# Patient Record
Sex: Male | Born: 1985 | Race: Black or African American | Hispanic: No | Marital: Single | Smoking: Former smoker
Health system: Southern US, Community
[De-identification: ages and names within clinical notes are randomized; demographics above are authoritative.]

## PROBLEM LIST (undated history)

## (undated) DIAGNOSIS — K219 Gastro-esophageal reflux disease without esophagitis: Secondary | ICD-10-CM

## (undated) DIAGNOSIS — C801 Malignant (primary) neoplasm, unspecified: Secondary | ICD-10-CM

## (undated) DIAGNOSIS — L509 Urticaria, unspecified: Secondary | ICD-10-CM

## (undated) DIAGNOSIS — C329 Malignant neoplasm of larynx, unspecified: Secondary | ICD-10-CM

## (undated) DIAGNOSIS — J45909 Unspecified asthma, uncomplicated: Secondary | ICD-10-CM

## (undated) HISTORY — PX: WRIST SURGERY: SHX841

## (undated) HISTORY — DX: Urticaria, unspecified: L50.9

---
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
## 2004-04-24 ENCOUNTER — Emergency Department (HOSPITAL_COMMUNITY): Admission: EM | Admit: 2004-04-24 | Discharge: 2004-04-24 | Payer: Self-pay

## 2006-06-07 ENCOUNTER — Emergency Department (HOSPITAL_COMMUNITY): Admission: EM | Admit: 2006-06-07 | Discharge: 2006-06-08 | Payer: Self-pay | Admitting: Emergency Medicine

## 2010-06-10 ENCOUNTER — Emergency Department (HOSPITAL_COMMUNITY)
Admission: EM | Admit: 2010-06-10 | Discharge: 2010-06-11 | Payer: Self-pay | Source: Home / Self Care | Admitting: Emergency Medicine

## 2010-09-02 ENCOUNTER — Emergency Department (HOSPITAL_COMMUNITY)
Admission: EM | Admit: 2010-09-02 | Discharge: 2010-09-02 | Disposition: A | Discharge status: Home / Self Care | Payer: Self-pay | Attending: Emergency Medicine | Admitting: Emergency Medicine

## 2010-09-02 DIAGNOSIS — M25519 Pain in unspecified shoulder: Secondary | ICD-10-CM | POA: Insufficient documentation

## 2010-09-02 DIAGNOSIS — M79609 Pain in unspecified limb: Secondary | ICD-10-CM | POA: Insufficient documentation

## 2010-09-02 DIAGNOSIS — R209 Unspecified disturbances of skin sensation: Secondary | ICD-10-CM | POA: Insufficient documentation

## 2010-09-03 ENCOUNTER — Emergency Department (HOSPITAL_COMMUNITY)
Admission: EM | Admit: 2010-09-03 | Discharge: 2010-09-04 | Disposition: A | Discharge status: Home / Self Care | Payer: Self-pay | Attending: Emergency Medicine | Admitting: Emergency Medicine

## 2010-09-03 DIAGNOSIS — M25519 Pain in unspecified shoulder: Secondary | ICD-10-CM | POA: Insufficient documentation

## 2010-09-03 DIAGNOSIS — M79609 Pain in unspecified limb: Secondary | ICD-10-CM | POA: Insufficient documentation

## 2010-09-03 DIAGNOSIS — R209 Unspecified disturbances of skin sensation: Secondary | ICD-10-CM | POA: Insufficient documentation

## 2011-03-15 ENCOUNTER — Emergency Department (HOSPITAL_BASED_OUTPATIENT_CLINIC_OR_DEPARTMENT_OTHER)
Admission: EM | Admit: 2011-03-15 | Discharge: 2011-03-15 | Disposition: A | Discharge status: Home / Self Care | Payer: Self-pay | Attending: Emergency Medicine | Admitting: Emergency Medicine

## 2011-03-15 DIAGNOSIS — T148XXA Other injury of unspecified body region, initial encounter: Secondary | ICD-10-CM

## 2011-03-15 DIAGNOSIS — IMO0002 Reserved for concepts with insufficient information to code with codable children: Secondary | ICD-10-CM | POA: Insufficient documentation

## 2011-03-15 DIAGNOSIS — F17200 Nicotine dependence, unspecified, uncomplicated: Secondary | ICD-10-CM | POA: Insufficient documentation

## 2011-03-15 DIAGNOSIS — X58XXXA Exposure to other specified factors, initial encounter: Secondary | ICD-10-CM | POA: Insufficient documentation

## 2011-03-15 DIAGNOSIS — Y9229 Other specified public building as the place of occurrence of the external cause: Secondary | ICD-10-CM | POA: Insufficient documentation

## 2011-03-15 MED ORDER — IBUPROFEN 800 MG PO TABS: 800.0000 mg | ORAL_TABLET | Freq: Three times a day (TID) | ORAL | Status: AC

## 2011-03-15 NOTE — ED Provider Notes (Signed)
History     CSN: [id number] Arrival date & time: 03/15/2011  5:33 PM   None     Chief Complaint  Patient presents with  . Leg Pain    (Consider location/radiation/quality/duration/timing/severity/associated sxs/prior treatment) HPI History provided by patient.  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  Developed cramping pain in right posterior leg after work yesterday evening.  Pain constant and aggravated by resting for extended periods of time.  No known trauma.  No risk factors for PE and no associated edema.   History reviewed. No pertinent past medical history.  History reviewed. No pertinent past surgical history.  No family history on file.  History  Substance Use Topics  . Smoking status: Current Some Day Smoker    Types: Cigars  . Smokeless tobacco: Never Used  . Alcohol Use: No      Review of Systems  All other systems reviewed and are negative.    Allergies  Review of patient's allergies indicates no known allergies.  Home Medications   Current Outpatient Rx  Name Route Sig Dispense Refill  . IBUPROFEN 800 MG PO TABS Oral Take 1 tablet (800 mg total) by mouth 3 (three) times daily. 12 tablet 0    BP 123/64  Pulse 72  Temp(Src) 98.3 F (36.8 C) (Oral)  Resp 16  Ht 5\' 7"  (1.702 m)  Wt 130 lb (58.968 kg)  BMI 20.36 kg/m2  SpO2 100%  Physical Exam  Nursing note and vitals reviewed. Constitutional: He is oriented to person, place, and time. He appears well-developed and well-nourished. No distress.  HENT:  Head: Normocephalic and atraumatic.  Eyes:       Normal appearance  Neck: Normal range of motion.  Cardiovascular: Normal rate and regular rhythm.   Pulmonary/Chest: Effort normal and breath sounds normal.  Musculoskeletal:       No deformity, skin changes or edema of RLE  Mild ttp right posterior thigh.  Full ROM of all joints but mild pain w/ inversion/eversion hip.  NV intact.   Neurological: He is alert and oriented to person,  place, and time.  Skin: Skin is warm and dry. No rash noted.  Psychiatric: He has a normal mood and affect. His behavior is normal.    ED Course  Procedures (including critical care time)  Labs Reviewed - No data to display No results found.   1. 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  S/sx most consistent w/ hamstring strain.  No risk factors for PE and no edema on exam.  Pt ambulates w/out difficulty.  Recommended rest, heat, NSAID.  Strict return precautions discussed.     Medical screening examination/treatment/procedure(s) were performed by non-physician practitioner and as supervising physician I was immediately available for consultation/collaboration. Osvaldo Human, M.D.     Arie Sabina Maxbass, PA 03/16/11 0725  Carleene Cooper III, MD 03/16/11 [phone]

## 2011-03-15 NOTE — ED Notes (Signed)
Right lower leg/calf pain since last night, pain radiating up leg

## 2011-05-07 ENCOUNTER — Encounter (HOSPITAL_BASED_OUTPATIENT_CLINIC_OR_DEPARTMENT_OTHER): Payer: Self-pay | Admitting: *Deleted

## 2011-05-07 ENCOUNTER — Emergency Department (HOSPITAL_BASED_OUTPATIENT_CLINIC_OR_DEPARTMENT_OTHER)
Admission: EM | Admit: 2011-05-07 | Discharge: 2011-05-08 | Disposition: A | Discharge status: Home / Self Care | Payer: Self-pay | Attending: Emergency Medicine | Admitting: Emergency Medicine

## 2011-05-07 DIAGNOSIS — M79604 Pain in right leg: Secondary | ICD-10-CM

## 2011-05-07 DIAGNOSIS — M79609 Pain in unspecified limb: Secondary | ICD-10-CM | POA: Insufficient documentation

## 2011-05-07 NOTE — ED Notes (Signed)
Patient ambulates from waiting room to stretcher with no difficulty and without assistance

## 2011-05-07 NOTE — ED Notes (Signed)
States he pulled a muscle in his right leg, first stated back in the summer and then stated a few weeks ago. Now it is hurting again.

## 2011-05-08 ENCOUNTER — Encounter (HOSPITAL_BASED_OUTPATIENT_CLINIC_OR_DEPARTMENT_OTHER): Payer: Self-pay | Admitting: Emergency Medicine

## 2011-05-08 MED ORDER — IBUPROFEN 800 MG PO TABS
800.0000 mg | ORAL_TABLET | Freq: Once | ORAL | Status: AC
  Administered 2011-05-08: 800 mg via ORAL
  Filled 2011-05-08: qty 1

## 2011-05-08 NOTE — ED Provider Notes (Signed)
History     CSN: [id number]  Arrival date & time 05/07/11  2200   First MD Initiated Contact with Patient 05/08/11 0106      Chief Complaint  Patient presents with  . Leg Pain    (Consider location/radiation/quality/duration/timing/severity/associated sxs/prior treatment) HPI Comments: Patient states that he pulled a muscle in his right lower leg in October.  It had recovered but yesterday started having sharp pain in that right lower leg again.  He denies any new injury.  He has no swelling, redness, fevers associated with this.  Patient has no recent long car or plane trips.  No shortness of breath.  No fevers.  He is able to ambulate and bear weight.  He did not take any medication at home for decided to come into the ER for further evaluation.  He does not have a primary care physician.  Patient is a 25 y.o. male presenting with leg pain. The history is provided by the patient. No language interpreter was used.  Leg Pain  The incident occurred yesterday. The incident occurred at home. There was no injury mechanism.    History reviewed. No pertinent past medical history.  History reviewed. No pertinent past surgical history.  History reviewed. No pertinent family history.  History  Substance Use Topics  . Smoking status: Current Some Day Smoker    Types: Cigars  . Smokeless tobacco: Never Used  . Alcohol Use: No      Review of Systems  Constitutional: Negative.  Negative for fever and chills.  HENT: Negative.   Eyes: Negative.  Negative for discharge and redness.  Respiratory: Negative.  Negative for cough and shortness of breath.   Cardiovascular: Negative.  Negative for chest pain.  Gastrointestinal: Negative.  Negative for nausea, vomiting and abdominal pain.  Genitourinary: Negative.  Negative for hematuria.  Musculoskeletal: Negative for back pain.  Skin: Negative.  Negative for color change and rash.  Neurological: Negative for syncope and headaches.    Hematological: Negative.  Negative for adenopathy.  Psychiatric/Behavioral: Negative.  Negative for confusion.  All other systems reviewed and are negative.    Allergies  Review of patient's allergies indicates no known allergies.  Home Medications  No current outpatient prescriptions on file.  BP 112/73  Pulse 73  Temp(Src) 98.2 F (36.8 C) (Oral)  Resp 20  SpO2 100%  Physical Exam  Constitutional: He is oriented to person, place, and time. He appears well-developed and well-nourished.  HENT:  Head: Normocephalic and atraumatic.  Eyes: Conjunctivae and EOM are normal. Pupils are equal, round, and reactive to light.  Neck: Normal range of motion. Neck supple.  Pulmonary/Chest: Effort normal.  Musculoskeletal: Normal range of motion. He exhibits no edema and no tenderness.       Palpable DP pulses bilaterally.  Legs appear symmetrical and are without any edema or swelling.  No crepitus lower legs.  Patient has normal strength in his right quadriceps and right calf.  No tenderness to palpation of his leg.  Neurological: He is alert and oriented to person, place, and time.  Skin: Skin is warm and dry. No rash noted. No erythema.  Psychiatric: He has a normal mood and affect. His behavior is normal. Judgment and thought content normal.    ED Course  Procedures (including critical care time)  Labs Reviewed - No data to display No results found.   No diagnosis found.    MDM  Patient with unclear etiology for his left lower leg pain.  There  is no indication that the patient has a DVT given that is not swollen and has no risk factors for DVT.  Patient has no known injury.  Patient is able to ambulate and bear weight on this leg.  There is no erythema or crepitus to indicate infection.  Patient's been counseled on to use ice to decrease inflammation and Tylenol and ibuprofen for pain.        Nat Christen, MD 05/08/11 [phone]

## 2011-09-07 ENCOUNTER — Emergency Department (HOSPITAL_COMMUNITY)
Admission: EM | Admit: 2011-09-07 | Discharge: 2011-09-07 | Disposition: A | Discharge status: Home / Self Care | Payer: Self-pay | Attending: Emergency Medicine | Admitting: Emergency Medicine

## 2011-09-07 ENCOUNTER — Encounter (HOSPITAL_COMMUNITY): Payer: Self-pay | Admitting: Emergency Medicine

## 2011-09-07 DIAGNOSIS — Z87891 Personal history of nicotine dependence: Secondary | ICD-10-CM | POA: Insufficient documentation

## 2011-09-07 DIAGNOSIS — M79609 Pain in unspecified limb: Secondary | ICD-10-CM | POA: Insufficient documentation

## 2011-09-07 DIAGNOSIS — M79606 Pain in leg, unspecified: Secondary | ICD-10-CM

## 2011-09-07 NOTE — ED Provider Notes (Signed)
History     CSN: [id number]  Arrival date & time 09/07/11  2039   First MD Initiated Contact with Patient 09/07/11 2155      Chief Complaint  Patient presents with  . 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 He denies injuring it. He says that it started last night and he didn't do anythign to try to alleviate the pain yet. He has been seen for leg pain two times in the past. He was instructed to try ice, ibuprofen and tylenol. He has not tried any of those things this time. He states, "he didn't think of it". He denies being unable to ambulate or work because of the leg pain. Denies fevers, chills, N/V/D.  History reviewed. No pertinent past medical history.  History reviewed. No pertinent past surgical history.  History reviewed. No pertinent family history.  History  Substance Use Topics  . Smoking status: Former Smoker    Types: Cigarettes  . Smokeless tobacco: Never Used  . Alcohol Use: No      Review of Systems   HEENT: denies blurry vision or change in hearing PULMONARY: Denies difficulty breathing and SOB CARDIAC: denies chest pain or heart palpitations MUSCULOSKELETAL:  denies being unable to ambulate ABDOMEN AL: denies abdominal pain GU: denies loss of bowel or urinary control NEURO: denies numbness and tingling in extremities   Allergies  Review of patient's allergies indicates no known allergies.  Home Medications  No current outpatient prescriptions on file.  BP 124/67  Pulse 77  Temp(Src) 98.3 F (36.8 C) (Oral)  Resp 20  SpO2 100%  Physical Exam  Nursing note and vitals reviewed. Constitutional: He appears well-developed and well-nourished. No distress.  HENT:  Head: Normocephalic and atraumatic.  Eyes: Pupils are equal, round, and reactive to light.  Neck: Normal range of motion. Neck supple.    Cardiovascular: Normal rate and regular rhythm.   Pulmonary/Chest: Effort normal.  Abdominal: Soft.  Musculoskeletal:       Legs:      Palpable DP pulses bilaterally.  Legs appear symmetrical and are without any edema or swelling.  No crepitus lower legs.  Patient has normal strength in his right quadriceps and right calf.  No tenderness to palpation of his leg.    Neurological: He is alert.  Skin: Skin is warm and dry.    ED Course  Procedures (including critical care time)  Labs Reviewed - No data to display No results found.   1. Leg pain       MDM   Patient with unclear etiology for his left lower leg pain. There is no indication that the patient has a DVT given that is not swollen and has no risk factors for DVT. Patient has no known injury. Patient is able to ambulate and bear weight on this leg. There is no erythema or crepitus to indicate infection. Patient's been counseled on to use ice to decrease inflammation and Tylenol and ibuprofen for pain.        Dorthula Matas, PA 09/07/11 2224

## 2011-09-07 NOTE — ED Notes (Addendum)
Pt presented to the Er with c/o right leg pain, states present for the 4-5 years now, however, last night pain changed, increased, 4/10, pt was taking at times some IBU, also has been seen at the [facility] and was told that has some "disk issues and noted space in between" that might be the cause of his problems. Pt denies any back pain at this time. Pt able to bear WT to the affected leg.

## 2011-09-07 NOTE — Discharge Instructions (Signed)
Use ice to decrease inflammation and Tylenol and ibuprofen for pain.    Pain of Unknown Etiology (Pain Without a Known Cause) You have come to your caregiver because of pain. Pain can occur in any part of the body. Often there is not a definite cause. If your laboratory (blood or urine) work was normal and x-rays or other studies were normal, your caregiver may treat you without knowing the cause of the pain. An example of this is the headache. Most headaches are diagnosed by taking a history. This means your caregiver asks you questions about your headaches. Your caregiver determines a treatment based on your answers. Usually testing done for headaches is normal. Often testing is not done unless there is no response to medications. Regardless of where your pain is located today, you can be given medications to make you comfortable. If no physical cause of pain can be found, most cases of pain will gradually leave as suddenly as they came.  If you have a painful condition and no reason can be found for the pain, It is importantthat you follow up with your caregiver. If the pain becomes worse or does not go away, it may be necessary to repeat tests and look further for a possible cause.  Only take over-the-counter or prescription medicines for pain, discomfort, or fever as directed by your caregiver.   For the protection of your privacy, test results can not be given over the phone. Make sure you receive the results of your test. Ask as to how these results are to be obtained if you have not been informed. It is your responsibility to obtain your test results.   You may continue all activities unless the activities cause more pain. When the pain lessens, it is important to gradually resume normal activities. Resume activities by beginning slowly and gradually increasing the intensity and duration of the activities or exercise. During periods of severe pain, bed-rest may be helpful. Lay or sit in any  position that is comfortable.   Ice used for acute (sudden) conditions may be effective. Use a large plastic bag filled with ice and wrapped in a towel. This may provide pain relief.   See your caregiver for continued problems. They can help or refer you for exercises or physical therapy if necessary.  If you were given medications for your condition, do not drive, operate machinery or power tools, or sign legal documents for 24 hours. Do not drink alcohol, take sleeping pills, or take other medications that may interfere with treatment. See your caregiver immediately if you have pain that is becoming worse and not relieved by medications. Document Released: 01/24/2001 Document Revised: 04/20/2011 Document Reviewed: 05/01/2005 [facility] Patient Information 2012 [place], [place].

## 2011-09-11 NOTE — ED Provider Notes (Signed)
Medical screening examination/treatment/procedure(s) were performed by non-physician practitioner and as supervising physician I was immediately available for consultation/collaboration.   Abishai Viegas, MD 09/11/11 0756 

## 2012-02-02 ENCOUNTER — Emergency Department (HOSPITAL_BASED_OUTPATIENT_CLINIC_OR_DEPARTMENT_OTHER): Payer: Self-pay

## 2012-02-02 ENCOUNTER — Emergency Department (HOSPITAL_BASED_OUTPATIENT_CLINIC_OR_DEPARTMENT_OTHER)
Admission: EM | Admit: 2012-02-02 | Discharge: 2012-02-02 | Disposition: A | Discharge status: Home / Self Care | Payer: Self-pay | Attending: Emergency Medicine | Admitting: Emergency Medicine

## 2012-02-02 ENCOUNTER — Encounter (HOSPITAL_BASED_OUTPATIENT_CLINIC_OR_DEPARTMENT_OTHER): Payer: Self-pay | Admitting: *Deleted

## 2012-02-02 DIAGNOSIS — M25512 Pain in left shoulder: Secondary | ICD-10-CM

## 2012-02-02 DIAGNOSIS — M25519 Pain in unspecified shoulder: Secondary | ICD-10-CM | POA: Insufficient documentation

## 2012-02-02 DIAGNOSIS — Z87891 Personal history of nicotine dependence: Secondary | ICD-10-CM | POA: Insufficient documentation

## 2012-02-02 MED ORDER — IBUPROFEN 800 MG PO TABS: 800.0000 mg | ORAL_TABLET | Freq: Three times a day (TID) | ORAL | Status: DC

## 2012-02-02 NOTE — ED Notes (Signed)
Pt c/o left shoulder pain x 2 days w/o injury

## 2012-02-02 NOTE — ED Provider Notes (Signed)
History     CSN: [id number]  Arrival date & time 02/02/12  1616   First MD Initiated Contact with Patient 02/02/12 1658      Chief Complaint  Patient presents with  . Shoulder Pain    (Consider location/radiation/quality/duration/timing/severity/associated sxs/prior treatment) Patient is a 26 y.o. male presenting with shoulder pain. The history is provided by the patient. No language interpreter was used.  Shoulder Pain This is a new problem. The current episode started today. The problem occurs constantly. The problem has been gradually worsening. Associated symptoms include joint swelling and myalgias. Nothing aggravates the symptoms. He has tried nothing for the symptoms. The treatment provided moderate relief.  Pt complains of pain in his left shoulder.  Pt reports he injured shoulder in the past but nothing recently.    History reviewed. No pertinent past medical history.  History reviewed. No pertinent past surgical history.  History reviewed. No pertinent family history.  History  Substance Use Topics  . Smoking status: Former Smoker    Types: Cigarettes  . Smokeless tobacco: Never Used  . Alcohol Use: No      Review of Systems  Musculoskeletal: Positive for myalgias and joint swelling.  All other systems reviewed and are negative.    Allergies  Review of patient's allergies indicates no known allergies.  Home Medications  No current outpatient prescriptions on file.  BP 134/74  Pulse 66  Temp 98.4 F (36.9 C) (Oral)  Resp 16  Ht 5\' 6"  (1.676 m)  Wt 135 lb (61.236 kg)  BMI 21.79 kg/m2  SpO2 100%  Physical Exam  Nursing note and vitals reviewed. Constitutional: He appears well-developed and well-nourished.  HENT:  Head: Normocephalic and atraumatic.  Cardiovascular: Normal rate.   Pulmonary/Chest: Effort normal.  Abdominal: Soft.  Musculoskeletal:       Tender left shoulder,  From,  Pain ac joint to palpation,  Neurological: He is alert.    Skin: Skin is warm.    ED Course  Procedures (including critical care time)  Labs Reviewed - No data to display Dg Shoulder Left  02/02/2012  *RADIOLOGY REPORT*  Clinical Data: Left shoulder pain for 1 day.  LEFT SHOULDER - 2+ VIEW  Comparison: None.  Findings: No fracture, dislocation, or acute bony findings.  The acromial undersurface is type 2 (curved).  IMPRESSION:  1.  No significant abnormality identified.   Original Report Authenticated By: Dellia Cloud, M.D.      No diagnosis found.    MDM  Pt advised ibuprofen and follow up with Dr. Pearletha Forge for recheck next week.        Lonia Skinner [place], [place] 02/02/12 1818

## 2012-02-03 NOTE — ED Provider Notes (Signed)
Medical screening examination/treatment/procedure(s) were performed by non-physician practitioner and as supervising physician I was immediately available for consultation/collaboration.   Carleene Cooper III, MD 02/03/12 1125

## 2012-03-27 ENCOUNTER — Emergency Department (HOSPITAL_COMMUNITY)
Admission: EM | Admit: 2012-03-27 | Discharge: 2012-03-28 | Disposition: A | Discharge status: Home / Self Care | Payer: BC Managed Care – PPO | Attending: Emergency Medicine | Admitting: Emergency Medicine

## 2012-03-27 ENCOUNTER — Encounter (HOSPITAL_COMMUNITY): Payer: Self-pay | Admitting: Emergency Medicine

## 2012-03-27 DIAGNOSIS — Z87891 Personal history of nicotine dependence: Secondary | ICD-10-CM | POA: Insufficient documentation

## 2012-03-27 DIAGNOSIS — J040 Acute laryngitis: Secondary | ICD-10-CM

## 2012-03-27 MED ORDER — PREDNISONE 20 MG PO TABS
60.0000 mg | ORAL_TABLET | Freq: Once | ORAL | Status: AC
  Administered 2012-03-27: 60 mg via ORAL
  Filled 2012-03-27: qty 3

## 2012-03-27 NOTE — ED Provider Notes (Signed)
History     CSN: [id number]  Arrival date & time 03/27/12  2230   First MD Initiated Contact with Patient 03/27/12 2258      Chief Complaint  Patient presents with  . Hoarse    (Consider location/radiation/quality/duration/timing/severity/associated sxs/prior treatment) HPI Comments: Gabriel Myers 26 y.o. male   The chief complaint is: Patient presents with:   Hoarse  C/o change in voice. Awoke Sunday with hoarse voice. Comes and goes.   Worse early am and after talking for a long time. + mnonproductive cough began yesterday, + sneezing yesterday, rhinorrhea. Denies fevers, chills, fatigue, night sweats, unexplained weight loss.  Denies fevers, chills, myalgias, arthralgias. Denies DOE, SOB, chest tightness or pressure, radiation to left arm, jaw or back, or diaphoresis. Denies dysuria, flank pain, suprapubic pain, frequency, urgency, or hematuria. Denies headaches, light headedness, weakness, visual disturbances. Denies abdominal pain, nausea, vomiting, diarrhea or constipation.         The history is provided by the patient. No language interpreter was used.    History reviewed. No pertinent past medical history.  History reviewed. No pertinent past surgical history.  History reviewed. No pertinent family history.  History  Substance Use Topics  . Smoking status: Former Smoker    Types: Cigarettes  . Smokeless tobacco: Never Used  . Alcohol Use: No      Review of Systems  Review of Systems  Constitutional: Negative.  Negative for fever and chills.  HENT: Negative.   Eyes: Negative.   Respiratory: Negative.  Negative for shortness of breath.  +cough, hoarseness Cardiovascular: Negative.  Negative for chest pain and palpitations.  Gastrointestinal: Negative.  Negative for vomiting, abdominal pain, diarrhea and constipation.  Genitourinary: Negative.  Negative for dysuria, urgency and frequency.  Musculoskeletal: Negative.  Negative for myalgias and  arthralgias.  Skin: Negative for rash and wound.  Neurological: Negative.  Negative for headaches.  Psychiatric/Behavioral: Negative.   All other systems reviewed and are negative.    Allergies  Review of patient's allergies indicates no known allergies.  Home Medications  No current outpatient prescriptions on file.  BP 124/74  Pulse 82  Temp 97.9 F (36.6 C) (Oral)  SpO2 100%  Physical Exam Physical Exam  Nursing note and vitals reviewed. Constitutional: He appears well-developed and well-nourished. No distress.  HENT:  Head: Normocephalic and atraumatic.  Eyes: Conjunctivae normal are normal. No scleral icterus.  Neck: Normal range of motion. Neck supple.  no pharyngeal erythema or edema.  No tonsillar swelling or exudates.  Mild nontender tonsillar lymphadenopathy. Cardiovascular: Normal rate, regular rhythm and normal heart sounds.   Pulmonary/Chest: Effort normal and breath sounds normal. No respiratory distress.  Abdominal: Soft. There is no tenderness.  Musculoskeletal: He exhibits no edema.  Neurological: He is alert.  Skin: Skin is warm and dry. He is not diaphoretic.  Psychiatric: His behavior is normal.    ED Course  Procedures (including critical care time)  Labs Reviewed - No data to display No results found.   No diagnosis found.    MDM  Patient with Laryngitis.  Acute onset.  No concern for intrathoracic pathology.  Will D/c with ENT f/u if sxs do not resolve.        Arthor Captain, PA-C 03/27/12 2355

## 2012-03-27 NOTE — ED Notes (Addendum)
Pt. Has been complaining of hoarseness since Sunday morniing.  No blood in sputum.  Pt. Complains that speech just will not come out.  No pain reported.  Some stiffness to right side of neck but no pain.  Pt. Has been coughing and sneezing since Sunday.

## 2012-03-28 NOTE — ED Provider Notes (Signed)
Medical screening examination/treatment/procedure(s) were performed by non-physician practitioner and as supervising physician I was immediately available for consultation/collaboration.   David H Yao, MD 03/28/12 1503 

## 2012-05-25 ENCOUNTER — Emergency Department (HOSPITAL_COMMUNITY)
Admission: EM | Admit: 2012-05-25 | Discharge: 2012-05-25 | Disposition: A | Discharge status: Home / Self Care | Payer: BC Managed Care – PPO | Attending: Emergency Medicine | Admitting: Emergency Medicine

## 2012-05-25 ENCOUNTER — Encounter (HOSPITAL_COMMUNITY): Payer: Self-pay | Admitting: *Deleted

## 2012-05-25 DIAGNOSIS — IMO0001 Reserved for inherently not codable concepts without codable children: Secondary | ICD-10-CM | POA: Insufficient documentation

## 2012-05-25 DIAGNOSIS — R197 Diarrhea, unspecified: Secondary | ICD-10-CM | POA: Insufficient documentation

## 2012-05-25 DIAGNOSIS — Z87891 Personal history of nicotine dependence: Secondary | ICD-10-CM | POA: Insufficient documentation

## 2012-05-25 LAB — CBC WITH DIFFERENTIAL/PLATELET
Basophils Absolute: 0 10*3/uL (ref 0.0–0.1)
Basophils Relative: 0 % (ref 0–1)
HCT: 44.1 % (ref 39.0–52.0)
MCHC: 33.8 g/dL (ref 30.0–36.0)
Monocytes Absolute: 0.4 10*3/uL (ref 0.1–1.0)
Neutro Abs: 3.8 10*3/uL (ref 1.7–7.7)
RDW: 13.6 % (ref 11.5–15.5)

## 2012-05-25 LAB — BASIC METABOLIC PANEL
Calcium: 9.4 mg/dL (ref 8.4–10.5)
Chloride: 106 mEq/L (ref 96–112)
Creatinine, Ser: 1.15 mg/dL (ref 0.50–1.35)
GFR calc Af Amer: >90 mL/min (ref >90)

## 2012-05-25 MED ORDER — ALUM & MAG HYDROXIDE-SIMETH 200-200-20 MG/5ML PO SUSP: 10.0000 mL | Freq: Four times a day (QID) | ORAL | Status: DC | PRN

## 2012-05-25 MED ORDER — GI COCKTAIL ~~LOC~~
30.0000 mL | Freq: Once | ORAL | Status: AC
  Administered 2012-05-25: 30 mL via ORAL
  Filled 2012-05-25: qty 30

## 2012-05-25 NOTE — ED Notes (Signed)
Pt reports onset last night of fever, bodyaches, diarrhea. Denies vomiting.

## 2012-05-25 NOTE — ED Provider Notes (Signed)
Medical screening examination/treatment/procedure(s) were performed by non-physician practitioner and as supervising physician I was immediately available for consultation/collaboration. Devoria Albe, MD, Armando Gang   Ward Givens, MD 05/25/12 1018

## 2012-05-25 NOTE — ED Provider Notes (Signed)
History     CSN: [id number]  Arrival date & time 05/25/12  4098   First MD Initiated Contact with Patient 05/25/12 0845      Chief Complaint  Patient presents with  . Influenza    (Consider location/radiation/quality/duration/timing/severity/associated sxs/prior treatment) HPI Comments: Patient is a 27 year old male with no significant past medical history who presents with a 1 day history of abdominal pain. The pain is located in his epigastrium and does not radiate. The pain is described as cramping and mild. The pain started gradually and progressively worsened since the onset. No alleviating/aggravating factors. The patient has tried nothing for symptoms without relief. Associated symptoms include diarrhea and myalgias. Patient denies fever, headache, nausea, vomiting, chest pain, SOB, dysuria, constipation.     History reviewed. No pertinent past medical history.  History reviewed. No pertinent past surgical history.  History reviewed. No pertinent family history.  History  Substance Use Topics  . Smoking status: Former Smoker    Types: Cigarettes  . Smokeless tobacco: Never Used  . Alcohol Use: No      Review of Systems  Gastrointestinal: Positive for abdominal pain and diarrhea.  Musculoskeletal: Positive for myalgias.  All other systems reviewed and are negative.    Allergies  Review of patient's allergies indicates no known allergies.  Home Medications  No current outpatient prescriptions on file.  BP 120/62  Pulse 97  Temp 98.1 F (36.7 C) (Oral)  Resp 16  SpO2 100%  Physical Exam  Nursing note and vitals reviewed. Constitutional: He is oriented to person, place, and time. He appears well-developed and well-nourished. No distress.  HENT:  Head: Normocephalic and atraumatic.  Mouth/Throat: Oropharynx is clear and moist. No oropharyngeal exudate.  Eyes: Conjunctivae normal are normal. No scleral icterus.  Neck: Normal range of motion. Neck  supple.  Cardiovascular: Normal rate and regular rhythm.  Exam reveals no gallop and no friction rub.   No murmur heard. Pulmonary/Chest: Effort normal and breath sounds normal. He has no wheezes. He has no rales. He exhibits no tenderness.  Abdominal: Soft. He exhibits no distension. There is tenderness. There is no rebound and no guarding.       Mild epigastric tenderness to palpation.   Musculoskeletal: Normal range of motion.  Neurological: He is alert and oriented to person, place, and time.       Speech is goal-oriented. Moves limbs without ataxia.   Skin: Skin is warm and dry.  Psychiatric: He has a normal mood and affect. His behavior is normal.    ED Course  Procedures (including critical care time)  Labs Reviewed  BASIC METABOLIC PANEL - Abnormal; Notable for the following:    GFR calc non Af Amer 86 (*)     All other components within normal limits  CBC WITH DIFFERENTIAL   No results found.   1. Diarrhea       MDM  8:59 AM Labs pending. Patient will have GI cocktail for epigastric discomfort. Patient afebrile with stable vitals.   10:06 AM Labs unremarkable. Patient very concerned about his work note for today. Patient will be discharged with mylanta for diarrhea. Patient afebrile and non toxic appearing. Vitals stable for discharge. No further evaluation needed at this time.      [name], PA-C 05/25/12 1010

## 2013-04-15 ENCOUNTER — Emergency Department (HOSPITAL_BASED_OUTPATIENT_CLINIC_OR_DEPARTMENT_OTHER): Payer: Self-pay

## 2013-04-15 ENCOUNTER — Encounter (HOSPITAL_BASED_OUTPATIENT_CLINIC_OR_DEPARTMENT_OTHER): Payer: Self-pay | Admitting: Emergency Medicine

## 2013-04-15 ENCOUNTER — Emergency Department (HOSPITAL_BASED_OUTPATIENT_CLINIC_OR_DEPARTMENT_OTHER)
Admission: EM | Admit: 2013-04-15 | Discharge: 2013-04-15 | Disposition: A | Discharge status: Home / Self Care | Payer: Self-pay | Attending: Emergency Medicine | Admitting: Emergency Medicine

## 2013-04-15 DIAGNOSIS — S8990XA Unspecified injury of unspecified lower leg, initial encounter: Secondary | ICD-10-CM | POA: Insufficient documentation

## 2013-04-15 DIAGNOSIS — Z87891 Personal history of nicotine dependence: Secondary | ICD-10-CM | POA: Insufficient documentation

## 2013-04-15 DIAGNOSIS — Y929 Unspecified place or not applicable: Secondary | ICD-10-CM | POA: Insufficient documentation

## 2013-04-15 DIAGNOSIS — IMO0002 Reserved for concepts with insufficient information to code with codable children: Secondary | ICD-10-CM | POA: Insufficient documentation

## 2013-04-15 DIAGNOSIS — Y939 Activity, unspecified: Secondary | ICD-10-CM | POA: Insufficient documentation

## 2013-04-15 DIAGNOSIS — M25561 Pain in right knee: Secondary | ICD-10-CM

## 2013-04-15 MED ORDER — HYDROCODONE-ACETAMINOPHEN 5-325 MG PO TABS: 1.0000 | ORAL_TABLET | Freq: Four times a day (QID) | ORAL | Status: DC | PRN

## 2013-04-15 MED ORDER — IBUPROFEN 600 MG PO TABS: 600.0000 mg | ORAL_TABLET | Freq: Four times a day (QID) | ORAL | Status: DC | PRN

## 2013-04-15 NOTE — ED Provider Notes (Signed)
CSN: [id number]     Arrival date & time 04/15/13  2114 History  This chart was scribed for Shon Baton, MD by Danella Maiers, ED Scribe. This patient was seen in room MH06/MH06 and the patient's care was started at 9:32 PM.     Chief Complaint  Patient presents with  . Knee Pain   The history is provided by the patient. No language interpreter was used.   HPI Comments: Gabriel Myers is a 27 y.o. male who presents to the Emergency Department complaining of sharp right knee pain that radiates to the right foot since knocking knees with 23 year old nephew 3 days ago. He denies any other injuries. He is able to ambulate with pain.    History reviewed. No pertinent past medical history. History reviewed. No pertinent past surgical history. No family history on file. History  Substance Use Topics  . Smoking status: Former Smoker    Types: Cigarettes  . Smokeless tobacco: Never Used  . Alcohol Use: No    Review of Systems  Musculoskeletal:       KNee pain and swelling  Skin: Negative for color change.    Allergies  Review of patient's allergies indicates no known allergies.  Home Medications   Current Outpatient Rx  Name  Route  Sig  Dispense  Refill  . alum & mag hydroxide-simeth (MYLANTA) 200-200-20 MG/5ML suspension   Oral   Take 10 mLs by mouth every 6 (six) hours as needed for indigestion.   355 mL   0   . HYDROcodone-acetaminophen (NORCO/VICODIN) 5-325 MG per tablet   Oral   Take 1 tablet by mouth every 6 (six) hours as needed.   10 tablet   0   . ibuprofen (ADVIL,MOTRIN) 600 MG tablet   Oral   Take 1 tablet (600 mg total) by mouth every 6 (six) hours as needed.   30 tablet   0    BP 127/84  Pulse 80  Temp(Src) 98.3 F (36.8 C)  Resp 16  Ht 5\' 6"  (1.676 m)  Wt 135 lb (61.236 kg)  BMI 21.80 kg/m2  SpO2 100% Physical Exam  Nursing note and vitals reviewed. Constitutional: He is oriented to person, place, and time. He appears well-developed and  well-nourished. No distress.  HENT:  Head: Normocephalic and atraumatic.  Cardiovascular: Normal rate and regular rhythm.   Pulmonary/Chest: Effort normal and breath sounds normal. No respiratory distress.  Abdominal: Soft. There is no tenderness.  Musculoskeletal: He exhibits no edema.  Full range of motion of the right knee, patient able to fire his quadriceps, no obvious deformity or effusion. No joint line tenderness. Crepitus noted with range of motion.  Lymphadenopathy:    He has no cervical adenopathy.  Neurological: He is alert and oriented to person, place, and time.  Skin: Skin is warm and dry.  Psychiatric: He has a normal mood and affect.    ED Course  Procedures (including critical care time) Medications - No data to display  DIAGNOSTIC STUDIES: Oxygen Saturation is 100% on RA, normal by my interpretation.    COORDINATION OF CARE: 9:45 PM- Discussed treatment plan with pt which includes knee x-ray. Pt agrees to plan.    Labs Review Labs Reviewed - No data to display Imaging Review Dg Knee Complete 4 Views Right  04/15/2013   CLINICAL DATA:  Right knee pain  EXAM: RIGHT KNEE - COMPLETE 4+ VIEW  COMPARISON:  None.  FINDINGS: The patella is mildly high riding. Otherwise,  there is no evidence of fracture, dislocation, or joint effusion. There is no evidence of arthropathy or other focal bone abnormality.  IMPRESSION: Mildly high-riding patella is likely positional. Correlate clinically if concerned for patellar tendon injury. Otherwise, no acute osseous abnormality of the right knee.  Consider repeat radiograph in 7-10 days if concern for fracture persists or MRI if there is clinical concern for internal derangement.   Electronically Signed   By: Jearld Lesch M.D.   On: 04/15/2013 22:01    EKG Interpretation   None       MDM   1. Knee pain, acute, right    Patient presents with knee pain. This is in the setting of injury 3 days ago. He is otherwise  nontoxic-appearing and denies any other injury. X-rays are notable for a mildly high riding patella which could indicate patella tendon injury. Recommendation for repeat radiographs in one week. Given patient's pain and crepitus on exam, will place patient in a knee immobilizer when ambulatory.  He will be given followup with sports medicine to have repeat when films obtained in one week. He will be given a short course of pain medication and was encouraged to use anti-inflammatories.  After history, exam, and medical workup I feel the patient has been appropriately medically screened and is safe for discharge home. Pertinent diagnoses were discussed with the patient. Patient was given return precautions.  I personally performed the services described in this documentation, which was scribed in my presence. The recorded information has been reviewed and is accurate.    Shon Baton, MD 04/15/13 2322

## 2013-04-15 NOTE — ED Notes (Signed)
Pain in right knee after getting hit in the knee cap by nephews knee 3 days ago.  No hx of knee problems.  Did not fall. Pain radiating down leg. Pt walks unassisted without difficulty.

## 2013-04-21 ENCOUNTER — Ambulatory Visit (INDEPENDENT_AMBULATORY_CARE_PROVIDER_SITE_OTHER): Payer: Self-pay | Admitting: Family Medicine

## 2013-04-21 ENCOUNTER — Encounter: Payer: Self-pay | Admitting: Family Medicine

## 2013-04-21 VITALS — BP 121/78 | HR 66 | Ht 66.0 in | Wt 135.0 lb

## 2013-04-21 DIAGNOSIS — S8991XA Unspecified injury of right lower leg, initial encounter: Secondary | ICD-10-CM

## 2013-04-21 DIAGNOSIS — S8990XA Unspecified injury of unspecified lower leg, initial encounter: Secondary | ICD-10-CM

## 2013-04-21 NOTE — Patient Instructions (Signed)
You have a knee contusion. Ice knee 15 minutes at a time 3-4 times a day. Ibuprofen 600mg  three times a day OR aleve 2 tabs twice a day with food for pain and inflammation. Knee brace as needed for support - use at work for next week, then try to use for half days for 1 week then see if you can go without it. Straight leg raises, leg raises with foot turned outwards, side leg raises, knee extensions 3 sets of 10 once a day. Add ankle weight if these become too easy. Follow up with me in 1 month. This should take total of 4-6 weeks to completely recover with the above.

## 2013-04-22 ENCOUNTER — Encounter: Payer: Self-pay | Admitting: Family Medicine

## 2013-04-22 DIAGNOSIS — S8991XA Unspecified injury of right lower leg, initial encounter: Secondary | ICD-10-CM | POA: Insufficient documentation

## 2013-04-22 NOTE — Assessment & Plan Note (Signed)
consistent with contusion.  Radiographs negative.  Exam otherwise benign.  Reassured patient.  Icing, nsaids, knee brace.  HEP reviewed.  F/u in 1 month for reevaluation.

## 2013-04-22 NOTE — Progress Notes (Signed)
Patient ID: Gabriel Myers, male   DOB: 05-17-1985, 27 y.o.   MRN: [id number]  PCP: Default, Provider, MD  Subjective:   HPI: Patient is a 27 y.o. male here for right knee injury.  Patient reports he was sitting on the couch playing with his nephew (55 years old). Nephew ran and hit patient's knee with his knee. Had some swelling initially. Has been elevating. Used immobilizer initially with x-rays negative for fracture. Pain up to 7/10 at first. Still getting sharp pain in knee most recently yesterday. No catching, locking, giving out.  History reviewed. No pertinent past medical history.  Current Outpatient Prescriptions on File Prior to Visit  Medication Sig Dispense Refill  . alum & mag hydroxide-simeth (MYLANTA) 200-200-20 MG/5ML suspension Take 10 mLs by mouth every 6 (six) hours as needed for indigestion.  355 mL  0  . HYDROcodone-acetaminophen (NORCO/VICODIN) 5-325 MG per tablet Take 1 tablet by mouth every 6 (six) hours as needed.  10 tablet  0  . ibuprofen (ADVIL,MOTRIN) 600 MG tablet Take 1 tablet (600 mg total) by mouth every 6 (six) hours as needed.  30 tablet  0   No current facility-administered medications on file prior to visit.    History reviewed. No pertinent past surgical history.  No Known Allergies  History   Social History  . Marital Status: Single    Spouse Name: N/A    Number of Children: N/A  . Years of Education: N/A   Occupational History  . Not on file.   Social History Main Topics  . Smoking status: Former Smoker    Types: Cigarettes  . Smokeless tobacco: Never Used  . Alcohol Use: No  . Drug Use: No  . Sexual Activity: Not on file   Other Topics Concern  . Not on file   Social History Narrative  . No narrative on file    Family History  Problem Relation Age of Onset  . Sudden death Neg Hx   . Hypertension Neg Hx   . Hyperlipidemia Neg Hx   . Heart attack Neg Hx   . Diabetes Neg Hx     BP 121/78  Pulse 66  Ht 5\' 6"  (1.676  m)  Wt 135 lb (61.236 kg)  BMI 21.80 kg/m2  Review of Systems: See HPI above.    Objective:  Physical Exam:  Gen: NAD  Right knee: No gross deformity, ecchymoses, effusion. TTP lateral joint line, lateral post patellar facet mildly.  No other tenderness. FROM. Negative ant/post drawers. Negative valgus/varus testing. Negative lachmanns. Negative mcmurrays, apleys, patellar apprehension. NV intact distally.    Assessment & Plan:  1. Right knee injury - consistent with contusion.  Radiographs negative.  Exam otherwise benign.  Reassured patient.  Icing, nsaids, knee brace.  HEP reviewed.  F/u in 1 month for reevaluation.

## 2013-05-22 ENCOUNTER — Ambulatory Visit: Payer: Self-pay | Admitting: Family Medicine

## 2013-06-05 ENCOUNTER — Ambulatory Visit (INDEPENDENT_AMBULATORY_CARE_PROVIDER_SITE_OTHER): Payer: Self-pay | Admitting: Family Medicine

## 2013-06-05 ENCOUNTER — Encounter: Payer: Self-pay | Admitting: Family Medicine

## 2013-06-05 VITALS — BP 126/73 | HR 76 | Ht 66.0 in | Wt 135.0 lb

## 2013-06-05 DIAGNOSIS — S99919A Unspecified injury of unspecified ankle, initial encounter: Secondary | ICD-10-CM

## 2013-06-05 DIAGNOSIS — S99929A Unspecified injury of unspecified foot, initial encounter: Secondary | ICD-10-CM

## 2013-06-05 DIAGNOSIS — S8991XA Unspecified injury of right lower leg, initial encounter: Secondary | ICD-10-CM

## 2013-06-05 DIAGNOSIS — S8990XA Unspecified injury of unspecified lower leg, initial encounter: Secondary | ICD-10-CM

## 2013-06-05 NOTE — Patient Instructions (Signed)
Take tylenol and/or aleve as needed for pain. Ice as needed. Fill out the stuff for [facility] Coverage - call us when this goes through and we can add physical therapy. Otherwise follow up with us as needed.

## 2013-06-06 ENCOUNTER — Encounter: Payer: Self-pay | Admitting: Family Medicine

## 2013-06-06 NOTE — Progress Notes (Signed)
Patient ID: Gabriel Myers, male   DOB: 03/09/1986, 28 y.o.   MRN: [id number]  PCP: Default, Provider, MD  Subjective:   HPI: Patient is a 28 y.o. male here for right knee injury.  12/8: Patient reports he was sitting on the couch playing with his nephew (29 years old). Nephew ran and hit patient's knee with his knee. Had some swelling initially. Has been elevating. Used immobilizer initially with x-rays negative for fracture. Pain up to 7/10 at first. Still getting sharp pain in knee most recently yesterday. No catching, locking, giving out.  06/05/13: Patient reports pain comes and goes within right knee. No swelling. Has been icing. Not taking regular medication. Pain throughout anterior knee, up thigh with soreness anteriorly. No catching, locking, giving out.  History reviewed. No pertinent past medical history.  Current Outpatient Prescriptions on File Prior to Visit  Medication Sig Dispense Refill  . alum & mag hydroxide-simeth (MYLANTA) 200-200-20 MG/5ML suspension Take 10 mLs by mouth every 6 (six) hours as needed for indigestion.  355 mL  0  . HYDROcodone-acetaminophen (NORCO/VICODIN) 5-325 MG per tablet Take 1 tablet by mouth every 6 (six) hours as needed.  10 tablet  0  . ibuprofen (ADVIL,MOTRIN) 600 MG tablet Take 1 tablet (600 mg total) by mouth every 6 (six) hours as needed.  30 tablet  0   No current facility-administered medications on file prior to visit.    History reviewed. No pertinent past surgical history.  No Known Allergies  History   Social History  . Marital Status: Single    Spouse Name: N/A    Number of Children: N/A  . Years of Education: N/A   Occupational History  . Not on file.   Social History Main Topics  . Smoking status: Former Smoker    Types: Cigarettes  . Smokeless tobacco: Never Used  . Alcohol Use: No  . Drug Use: No  . Sexual Activity: Not on file   Other Topics Concern  . Not on file   Social History Narrative  .  No narrative on file    Family History  Problem Relation Age of Onset  . Sudden death Neg Hx   . Hypertension Neg Hx   . Hyperlipidemia Neg Hx   . Heart attack Neg Hx   . Diabetes Neg Hx     BP 126/73  Pulse 76  Ht 5\' 6"  (1.676 m)  Wt 135 lb (61.236 kg)  BMI 21.80 kg/m2  Review of Systems: See HPI above.    Objective:  Physical Exam:  Gen: NAD  Right knee: No gross deformity, ecchymoses, effusion. Mild TTP distal quad, post patellar facets, lateral and medial joint lines. No other tenderness. FROM. Negative ant/post drawers. Negative valgus/varus testing. Negative lachmanns. Negative mcmurrays, apleys, patellar apprehension. NV intact distally.    Assessment & Plan:  1. Right knee injury - consistent with contusion as primary injury.  Believe at this point most of his pain is due to quad inhibition from pain and decreased strength as a result.  Exam otherwise benign.  Start physical therapy when [facility] coverage goes through.  Icing, tylenol, aleve as needed.

## 2013-06-09 ENCOUNTER — Encounter: Payer: Self-pay | Admitting: Family Medicine

## 2013-06-09 NOTE — Assessment & Plan Note (Signed)
consistent with contusion as primary injury.  Believe at this point most of his pain is due to quad inhibition from pain and decreased strength as a result.  Exam otherwise benign.  Start physical therapy when [facility] coverage goes through.  Icing, tylenol, aleve as needed.

## 2013-12-31 ENCOUNTER — Ambulatory Visit (INDEPENDENT_AMBULATORY_CARE_PROVIDER_SITE_OTHER): Payer: BC Managed Care – PPO | Admitting: Family Medicine

## 2013-12-31 ENCOUNTER — Encounter: Payer: Self-pay | Admitting: Family Medicine

## 2013-12-31 VITALS — BP 120/76 | HR 69 | Ht 66.0 in | Wt 132.0 lb

## 2013-12-31 DIAGNOSIS — M79609 Pain in unspecified limb: Secondary | ICD-10-CM

## 2013-12-31 DIAGNOSIS — M79604 Pain in right leg: Secondary | ICD-10-CM

## 2013-12-31 MED ORDER — PREDNISONE (PAK) 10 MG PO TABS: ORAL_TABLET | ORAL | Status: DC

## 2013-12-31 NOTE — Patient Instructions (Signed)
Your exam is normal (no evidence of damage to structures of the knee or ankle). An irritated nerve can cause these types of symptoms. Start prednisone dose pack for 6 days. Start physical therapy for strengthening and stretching of the muscles affected in your right leg. Do home exercises on days you don't go to therapy. Follow up with me in 1 month. If not improving would consider nerve conduction studies.

## 2014-01-02 ENCOUNTER — Encounter: Payer: Self-pay | Admitting: Family Medicine

## 2014-01-02 DIAGNOSIS — M79604 Pain in right leg: Secondary | ICD-10-CM | POA: Insufficient documentation

## 2014-01-02 NOTE — Assessment & Plan Note (Signed)
exam again benign.  Discussed possibly represents nerve irritation as in radiculopathy.  Will trial prednisone for 6 days.  Start physical therapy and home exercise program.  F/u in 1 month.  Could consider nerve conduction studies if not improving.

## 2014-01-02 NOTE — Progress Notes (Signed)
Patient ID: Gabriel Myers, male   DOB: 02/15/1986, 28 y.o.   MRN: [id number]  PCP: Default, Provider, MD  Subjective:   HPI: Patient is a 28 y.o. male here for right knee injury.  12/8: Patient reports he was sitting on the couch playing with his nephew (248 years old). Nephew ran and hit patient's knee with his knee. Had some swelling initially. Has been elevating. Used immobilizer initially with x-rays negative for fracture. Pain up to 7/10 at first. Still getting sharp pain in knee most recently yesterday. No catching, locking, giving out.  06/05/13: Patient reports pain comes and goes within right knee. No swelling. Has been icing. Not taking regular medication. Pain throughout anterior knee, up thigh with soreness anteriorly. No catching, locking, giving out.  8/19: Patient reports about 1 1/2 months ago pain started coming back in right leg. Started bottom of his foot going up right leg. Slight tingling associated with this. Tried exercise. No meds, heat or ice tried. No problems with left leg. Constant pain up to 6/10 level. No back pain.  History reviewed. No pertinent past medical history.  Current Outpatient Prescriptions on File Prior to Visit  Medication Sig Dispense Refill  . alum & mag hydroxide-simeth (MYLANTA) 200-200-20 MG/5ML suspension Take 10 mLs by mouth every 6 (six) hours as needed for indigestion.  355 mL  0   No current facility-administered medications on file prior to visit.    History reviewed. No pertinent past surgical history.  No Known Allergies  History   Social History  . Marital Status: Single    Spouse Name: N/A    Number of Children: N/A  . Years of Education: N/A   Occupational History  . Not on file.   Social History Main Topics  . Smoking status: Former Smoker    Types: Cigarettes  . Smokeless tobacco: Never Used  . Alcohol Use: No  . Drug Use: No  . Sexual Activity: Not on file   Other Topics Concern  . Not on  file   Social History Narrative  . No narrative on file    Family History  Problem Relation Age of Onset  . Sudden death Neg Hx   . Hypertension Neg Hx   . Hyperlipidemia Neg Hx   . Heart attack Neg Hx   . Diabetes Neg Hx     BP 120/76  Pulse 69  Ht 5\' 6"  (1.676 m)  Wt 132 lb (59.875 kg)  BMI 21.32 kg/m2  Review of Systems: See HPI above.    Objective:  Physical Exam:  Gen: NAD  Back: No gross deformity, scoliosis. No TTP .  No midline or bony TTP. FROM. Strength LEs 5/5 all muscle groups.  2+ MSRs in patellar and achilles tendons, equal bilaterally. Negative SLRs. Sensation intact to light touch bilaterally. Negative logroll bilateral hips Negative fabers and piriformis stretches.  Right ankle: No gross deformity, swelling, ecchymoses FROM without pain. No TTP Negative ant drawer and talar tilt.   Negative syndesmotic compression. Thompsons test negative. NV intact distally.  Right knee: No gross deformity, ecchymoses, effusion. Minimal TTP distal quad, post patellar facets, lateral and medial joint lines. No other tenderness. FROM. Negative ant/post drawers. Negative valgus/varus testing. Negative lachmanns. Negative mcmurrays, apleys, patellar apprehension. NV intact distally.    Assessment & Plan:  1. Right leg pain - exam again benign.  Discussed possibly represents nerve irritation as in radiculopathy.  Will trial prednisone for 6 days.  Start physical therapy and home exercise  program.  F/u in 1 month.  Could consider nerve conduction studies if not improving.

## 2015-03-20 ENCOUNTER — Emergency Department (HOSPITAL_BASED_OUTPATIENT_CLINIC_OR_DEPARTMENT_OTHER)
Admission: EM | Admit: 2015-03-20 | Discharge: 2015-03-21 | Disposition: A | Discharge status: Home / Self Care | Payer: Self-pay | Attending: Physician Assistant | Admitting: Physician Assistant

## 2015-03-20 ENCOUNTER — Emergency Department (HOSPITAL_BASED_OUTPATIENT_CLINIC_OR_DEPARTMENT_OTHER): Payer: Self-pay

## 2015-03-20 ENCOUNTER — Encounter (HOSPITAL_BASED_OUTPATIENT_CLINIC_OR_DEPARTMENT_OTHER): Payer: Self-pay | Admitting: *Deleted

## 2015-03-20 DIAGNOSIS — J111 Influenza due to unidentified influenza virus with other respiratory manifestations: Secondary | ICD-10-CM | POA: Insufficient documentation

## 2015-03-20 DIAGNOSIS — Z87891 Personal history of nicotine dependence: Secondary | ICD-10-CM | POA: Insufficient documentation

## 2015-03-20 LAB — CBC WITH DIFFERENTIAL/PLATELET
Basophils Absolute: 0 10*3/uL (ref 0.0–0.1)
Basophils Relative: 0 %
Eosinophils Absolute: 0 10*3/uL (ref 0.0–0.7)
Eosinophils Relative: 0 %
HEMATOCRIT: 45.2 % (ref 39.0–52.0)
HEMOGLOBIN: 15.3 g/dL (ref 13.0–17.0)
LYMPHS ABS: 1 10*3/uL (ref 0.7–4.0)
LYMPHS PCT: 9 %
MCH: 27.8 pg (ref 26.0–34.0)
MCHC: 33.8 g/dL (ref 30.0–36.0)
MCV: 82 fL (ref 78.0–100.0)
MONOS PCT: 6 %
Monocytes Absolute: 0.7 10*3/uL (ref 0.1–1.0)
NEUTROS ABS: 9.3 10*3/uL — AB (ref 1.7–7.7)
Neutrophils Relative %: 85 %
Platelets: 177 10*3/uL (ref 150–400)
RBC: 5.51 MIL/uL (ref 4.22–5.81)
RDW: 13.9 % (ref 11.5–15.5)
WBC: 10.9 10*3/uL — AB (ref 4.0–10.5)

## 2015-03-20 LAB — COMPREHENSIVE METABOLIC PANEL
ALBUMIN: 4.5 g/dL (ref 3.5–5.0)
ALK PHOS: 31 U/L — AB (ref 38–126)
ALT: 11 U/L — ABNORMAL LOW (ref 17–63)
ANION GAP: 8 (ref 5–15)
AST: 16 U/L (ref 15–41)
BILIRUBIN TOTAL: 0.8 mg/dL (ref 0.3–1.2)
BUN: 12 mg/dL (ref 6–20)
CALCIUM: 9.2 mg/dL (ref 8.9–10.3)
CO2: 24 mmol/L (ref 22–32)
Chloride: 106 mmol/L (ref 101–111)
Creatinine, Ser: 1.06 mg/dL (ref 0.61–1.24)
GFR calc Af Amer: >60 mL/min (ref >60)
GLUCOSE: 123 mg/dL — AB (ref 65–99)
Potassium: 3.4 mmol/L — ABNORMAL LOW (ref 3.5–5.1)
Sodium: 138 mmol/L (ref 135–145)
TOTAL PROTEIN: 7.7 g/dL (ref 6.5–8.1)

## 2015-03-20 LAB — URINE MICROSCOPIC-ADD ON

## 2015-03-20 LAB — URINALYSIS, ROUTINE W REFLEX MICROSCOPIC
GLUCOSE, UA: NEGATIVE mg/dL
KETONES UR: 15 mg/dL — AB
Leukocytes, UA: NEGATIVE
NITRITE: NEGATIVE
PH: 6 (ref 5.0–8.0)
Protein, ur: 30 mg/dL — AB
Specific Gravity, Urine: 1.036 — ABNORMAL HIGH (ref 1.005–1.030)
Urobilinogen, UA: 1 mg/dL (ref 0.0–1.0)

## 2015-03-20 LAB — RAPID STREP SCREEN (MED CTR MEBANE ONLY): Streptococcus, Group A Screen (Direct): NEGATIVE

## 2015-03-20 MED ORDER — IBUPROFEN 800 MG PO TABS: 800.0000 mg | ORAL_TABLET | Freq: Three times a day (TID) | ORAL | Status: DC

## 2015-03-20 MED ORDER — SODIUM CHLORIDE 0.9 % IV BOLUS (SEPSIS)
1000.0000 mL | Freq: Once | INTRAVENOUS | Status: AC
  Administered 2015-03-20: 1000 mL via INTRAVENOUS

## 2015-03-20 MED ORDER — IBUPROFEN 800 MG PO TABS
800.0000 mg | ORAL_TABLET | Freq: Once | ORAL | Status: AC
  Administered 2015-03-20: 800 mg via ORAL
  Filled 2015-03-20: qty 1

## 2015-03-20 NOTE — ED Provider Notes (Signed)
CSN: [id number]     Arrival date & time 03/20/15  1755 History  By signing my name below, I, Gabriel Myers, attest that this documentation has been prepared under the direction and in the presence of Godric Lavell Randall AnLyn Kayde Warehime, MD. Electronically Signed: Octavia HeirArianna Myers, ED Scribe. 03/20/2015. 8:52 PM.    Chief Complaint  Patient presents with  . Influenza     The history is provided by the patient. No language interpreter was used.   HPI Comments: Gabriel Myers is a 29 y.o. male who presents to the Emergency Department complaining of moderate, gradual worsening body aches onset this morning. Pt has been having associated fever, sore throat, loss of appetite and reports coughing once. He denies nausea, vomiting, diarrhea, rash, urinary symptoms, penile discharge, and tick bite.  History reviewed. No pertinent past medical history. History reviewed. No pertinent past surgical history. Family History  Problem Relation Age of Onset  . Sudden death Neg Hx   . Hypertension Neg Hx   . Hyperlipidemia Neg Hx   . Heart attack Neg Hx   . Diabetes Neg Hx    Social History  Substance Use Topics  . Smoking status: Former Smoker    Types: Cigarettes  . Smokeless tobacco: Never Used  . Alcohol Use: No    Review of Systems  Gastrointestinal: Negative for nausea, vomiting and diarrhea.  Genitourinary: Negative for dysuria, hematuria, discharge and difficulty urinating.  Skin: Negative for rash.  All other systems reviewed and are negative.     Allergies  Review of patient's allergies indicates no known allergies.  Home Medications   Prior to Admission medications   Medication Sig Start Date End Date Taking? Authorizing Provider  alum & mag hydroxide-simeth (MYLANTA) 200-200-20 MG/5ML suspension Take 10 mLs by mouth every 6 (six) hours as needed for indigestion. 05/25/12   Kaitlyn Szekalski, PA-C  predniSONE (STERAPRED UNI-PAK) 10 MG tablet 6 tabs po day 1, 5 tabs po day 2, 4 tabs po day 3, 3  tabs po day 4, 2 tabs po day 5, 1 tab po day 6 12/31/13   Lenda KelpShane R Hudnall, MD   Triage vitals: BP 143/85 mmHg  Pulse 89  Temp(Src) 102 F (38.9 C) (Oral)  Resp 22  Ht 5\' 6"  (1.676 m)  Wt 133 lb (60.328 kg)  BMI 21.48 kg/m2  SpO2 100% Physical Exam  Constitutional: He is oriented to person, place, and time. He appears well-developed and well-nourished.  HENT:  Head: Normocephalic and atraumatic.  No meningeus signs, posterior oropharynx is normal, 1 pustule in back of throat, mild cervical adenopathy  Eyes: EOM are normal.  Neck: Normal range of motion.  Cardiovascular: Normal rate, regular rhythm, normal heart sounds and intact distal pulses.   Pulmonary/Chest: Effort normal and breath sounds normal. No respiratory distress.  Abdominal: Soft. He exhibits no distension. There is no tenderness.  Musculoskeletal: Normal range of motion.  Neurological: He is alert and oriented to person, place, and time.  Skin: Skin is warm and dry.  Psychiatric: He has a normal mood and affect. Judgment normal.  Nursing note and vitals reviewed.   ED Course  Procedures  DIAGNOSTIC STUDIES: Oxygen Saturation is 100% on RA, normal by my interpretation.  COORDINATION OF CARE:  8:52 PM Discussed treatment plan which includes fluids with pt at bedside and pt agreed to plan.  Labs Review Labs Reviewed  URINE CULTURE  RAPID STREP SCREEN (NOT AT [facility])  CBC WITH DIFFERENTIAL/PLATELET  COMPREHENSIVE METABOLIC PANEL  URINALYSIS, ROUTINE W  REFLEX MICROSCOPIC (NOT AT [facility])    Imaging Review Dg Chest 2 View  03/20/2015  CLINICAL DATA:  Fever and body aches for 1 day. EXAM: CHEST  2 VIEW COMPARISON:  06/10/2010 FINDINGS: Cardiac silhouette normal in size and configuration. Normal mediastinal and hilar contours. Minor focus of reticular scarring in the right upper lobe, stable. Lungs otherwise clear. No pleural effusion or pneumothorax. Skeletal structures are unremarkable. IMPRESSION: No active  cardiopulmonary disease. Electronically Signed   By: Amie Portland M.D.   On: 03/20/2015 18:51   I have personally reviewed and evaluated these images and lab results as part of my medical decision-making.   EKG Interpretation None      MDM   Final diagnoses:  None    Patient is a 28 year old male presenting with chills and fever that started last night 2 AM. He has no focal symptoms. No signs of meningitis. He got mild nausea. He otherwise has no abdominal pain, cough congestion symptoms except body aches. Patient denies any dysuria or discharge. He has one tiny spot of exudate on his tonsils. We will get a strep test.  Otherwise we'll give fluids, iburofen and treat symptomatically. If all these are normal we will discharge patient home with return precautions.   I personally performed the services described in this documentation, which was scribed in my presence. The recorded information has been reviewed and is accurate.       [name], MD 03/21/15 2223

## 2015-03-20 NOTE — Discharge Instructions (Signed)
We did not find a source for your fever today. We think it may be due to a virus, or flu. We will need to be sure to take plenty of fluids at home and follow-up with your regular doctor. Please return if you have any focal symptoms. Influenza, Adult Influenza ("the flu") is a viral infection of the respiratory tract. It occurs more often in winter months because people spend more time in close contact with one another. Influenza can make you feel very sick. Influenza easily spreads from person to person (contagious). CAUSES  Influenza is caused by a virus that infects the respiratory tract. You can catch the virus by breathing in droplets from an infected person's cough or sneeze. You can also catch the virus by touching something that was recently contaminated with the virus and then touching your mouth, nose, or eyes. RISKS AND COMPLICATIONS You may be at risk for a more severe case of influenza if you smoke cigarettes, have diabetes, have chronic heart disease (such as heart failure) or lung disease (such as asthma), or if you have a weakened immune system. Elderly people and pregnant women are also at risk for more serious infections. The most common problem of influenza is a lung infection (pneumonia). Sometimes, this problem can require emergency medical care and may be life threatening. SIGNS AND SYMPTOMS  Symptoms typically last 4 to 10 days and may include:  Fever.  Chills.  Headache, body aches, and muscle aches.  Sore throat.  Chest discomfort and cough.  Poor appetite.  Weakness or feeling tired.  Dizziness.  Nausea or vomiting. DIAGNOSIS  Diagnosis of influenza is often made based on your history and a physical exam. A nose or throat swab test can be done to confirm the diagnosis. TREATMENT  In mild cases, influenza goes away on its own. Treatment is directed at relieving symptoms. For more severe cases, your health care provider may prescribe antiviral medicines to shorten  the sickness. Antibiotic medicines are not effective because the infection is caused by a virus, not by bacteria. HOME CARE INSTRUCTIONS  Take medicines only as directed by your health care provider.  Use a cool mist humidifier to make breathing easier.  Get plenty of rest until your temperature returns to normal. This usually takes 3 to 4 days.  Drink enough fluid to keep your urine clear or pale yellow.  Cover yourmouth and nosewhen coughing or sneezing,and wash your handswellto prevent thevirusfrom spreading.  Stay homefromwork orschool untilthe fever is gonefor at least 631full day. PREVENTION  An annual influenza vaccination (flu shot) is the best way to avoid getting influenza. An annual flu shot is now routinely recommended for all adults in the [place] SEEK MEDICAL CARE IF:  You experiencechest pain, yourcough worsens,or you producemore mucus.  Youhave nausea,vomiting, ordiarrhea.  Your fever returns or gets worse. SEEK IMMEDIATE MEDICAL CARE IF:  You havetrouble breathing, you become short of breath,or your skin ornails becomebluish.  You have severe painor stiffnessin the neck.  You develop a sudden headache, or pain in the face or ear.  You have nausea or vomiting that you cannot control. MAKE SURE YOU:   Understand these instructions.  Will watch your condition.  Will get help right away if you are not doing well or get worse.   This information is not intended to replace advice given to you by your health care provider. Make sure you discuss any questions you have with your health care provider.   Document Released: 04/28/2000  Document Revised: 05/22/2014 Document Reviewed: 07/31/2011 Elsevier Interactive Patient Education [employer].

## 2015-03-20 NOTE — ED Notes (Signed)
Pt reports he feels better after receiving ibuprofen- denies n/v/d or other symptoms

## 2015-03-20 NOTE — ED Notes (Signed)
Pt here with report of body aches. Pt states that he has been having body aches since last pm and some URI symptoms.  No productive cough

## 2015-03-20 NOTE — ED Notes (Signed)
Patient was unable to provide urine sample at this time. RN ellen aware. Asked patient to push call bell if he is able to obtain sample.

## 2015-03-22 LAB — URINE CULTURE

## 2015-03-24 LAB — CULTURE, GROUP A STREP: STREP A CULTURE: NEGATIVE

## 2016-10-12 ENCOUNTER — Emergency Department (HOSPITAL_BASED_OUTPATIENT_CLINIC_OR_DEPARTMENT_OTHER)
Admission: EM | Admit: 2016-10-12 | Discharge: 2016-10-12 | Disposition: A | Discharge status: Home / Self Care | Payer: BLUE CROSS/BLUE SHIELD | Attending: Emergency Medicine | Admitting: Emergency Medicine

## 2016-10-12 ENCOUNTER — Encounter (HOSPITAL_BASED_OUTPATIENT_CLINIC_OR_DEPARTMENT_OTHER): Payer: Self-pay | Admitting: *Deleted

## 2016-10-12 DIAGNOSIS — Z87891 Personal history of nicotine dependence: Secondary | ICD-10-CM | POA: Diagnosis not present

## 2016-10-12 DIAGNOSIS — M25562 Pain in left knee: Secondary | ICD-10-CM | POA: Insufficient documentation

## 2016-10-12 DIAGNOSIS — M79605 Pain in left leg: Secondary | ICD-10-CM | POA: Diagnosis present

## 2016-10-12 MED ORDER — METHOCARBAMOL 500 MG PO TABS: 500.0000 mg | ORAL_TABLET | Freq: Four times a day (QID) | ORAL | 0 refills | Status: DC | PRN

## 2016-10-12 MED ORDER — MELOXICAM 7.5 MG PO TABS: 7.5000 mg | ORAL_TABLET | Freq: Every day | ORAL | 0 refills | Status: DC | PRN

## 2016-10-12 NOTE — ED Triage Notes (Signed)
Pain in his left upper leg x 2 weeks. Pain started when he bent over to tie the trash and he felt a pop.

## 2016-10-12 NOTE — Discharge Instructions (Signed)
Read the information below.  Use the prescribed medication as directed.  Please discuss all new medications with your pharmacist.  You may return to the Emergency Department at any time for worsening condition or any new symptoms that concern you.  If you develop uncontrolled pain, weakness or numbness of the extremity, severe discoloration of the skin, or you are unable to walk or bend your knee, return to the ER for a recheck.    °

## 2016-10-12 NOTE — ED Notes (Signed)
Took Ibuprofen at work around 12 with no effect.

## 2016-10-12 NOTE — ED Provider Notes (Signed)
[facility]   CSN: [id number] Arrival date & time: 10/12/16  1526     History   Chief Complaint Chief Complaint  Patient presents with  . Leg Pain    HPI Gabriel Myers is a 31 y.o. male.  HPI   Pt p/w left knee pain following incident 2 weeks ago in which he heard and felt a pop in his knee, felt sharp pain after leaning forward and standing up.  The pain lasted for two days, resolved, and returned yesterday without new injury.  He works on a [employer]. Has tried ibuprofen, elevation, rest, ice without improvement.   Denies fevers, recent illness, back pain, weakness/numbness of the extremity.     History reviewed. No pertinent past medical history.  Patient Active Problem List   Diagnosis Date Noted  . Right leg pain 01/02/2014  . Right knee injury 04/22/2013    History reviewed. No pertinent surgical history.     Home Medications    Prior to Admission medications   Medication Sig Start Date End Date Taking? Authorizing Provider  alum & mag hydroxide-simeth (MYLANTA) 200-200-20 MG/5ML suspension Take 10 mLs by mouth every 6 (six) hours as needed for indigestion. 05/25/12   [name], PA-C  ibuprofen (ADVIL,MOTRIN) 800 MG tablet Take 1 tablet (800 mg total) by mouth 3 (three) times daily. 03/20/15   Mackuen, Courteney Lyn, MD  meloxicam (MOBIC) 7.5 MG tablet Take 1 tablet (7.5 mg total) by mouth daily as needed for pain. 10/12/16   Trixie Dredge, PA-C  methocarbamol (ROBAXIN) 500 MG tablet Take 1-2 tablets (500-1,000 mg total) by mouth every 6 (six) hours as needed for muscle spasms (and pain). 10/12/16   Trixie Dredge, PA-C  predniSONE (STERAPRED UNI-PAK) 10 MG tablet 6 tabs po day 1, 5 tabs po day 2, 4 tabs po day 3, 3 tabs po day 4, 2 tabs po day 5, 1 tab po day 6 12/31/13   Hudnall, Azucena Fallen, MD    Family History Family History  Problem Relation Age of Onset  . Sudden death Neg Hx   . Hypertension Neg Hx   . Hyperlipidemia Neg  Hx   . Heart attack Neg Hx   . Diabetes Neg Hx     Social History Social History  Substance Use Topics  . Smoking status: Former Smoker    Types: Cigarettes  . Smokeless tobacco: Never Used  . Alcohol use No     Allergies   Patient has no known allergies.   Review of Systems Review of Systems  Constitutional: Negative for chills and fever.  Cardiovascular: Negative for leg swelling.  Musculoskeletal: Positive for arthralgias and gait problem. Negative for back pain and joint swelling.  Skin: Negative for color change, pallor and wound.  Allergic/Immunologic: Negative for immunocompromised state.  Neurological: Negative for weakness and numbness.  Hematological: Does not bruise/bleed easily.  Psychiatric/Behavioral: Negative for self-injury.     Physical Exam Updated Vital Signs BP 120/76 (BP Location: Left Arm)   Pulse 71   Temp 98.5 F (36.9 C) (Oral)   Resp 16   SpO2 100%   Physical Exam  Constitutional: He appears well-developed and well-nourished. No distress.  HENT:  Head: Normocephalic and atraumatic.  Neck: Neck supple.  Pulmonary/Chest: Effort normal.  Musculoskeletal:  Left lower extremity with tightness and tenderness of IT band, tenderness over superiomedial aspect of knee.  Pain with thessaly test.  No pain or laxity with stress in any direction.  NO erythema,  edema,warmth throughout leg.  No tenderness of calf.  Distal pulses intact.    Neurological: He is alert.  Skin: He is not diaphoretic.  Nursing note and vitals reviewed.    ED Treatments / Results  Labs (all labs ordered are listed, but only abnormal results are displayed) Labs Reviewed - No data to display  EKG  EKG Interpretation None       Radiology No results found.  Procedures Procedures (including critical care time)  Medications Ordered in ED Medications - No data to display   Initial Impression / Assessment and Plan / ED Course  I have reviewed the triage vital  signs and the nursing notes.  Pertinent labs & imaging results that were available during my care of the patient were reviewed by me and considered in my medical decision making (see chart for details).    Afebrile, nontoxic patient with injury to his left knee while bending forward two weeks ago - no significant trauma.  Emergent imaging not indicated.  Pt placed in knee sleeve, declined crutches.  D/C home with mobic, robaxin, orthopedic follow up.   Discussed result, findings, treatment, and follow up  with patient.  Pt given return precautions.  Pt verbalizes understanding and agrees with plan.      Final Clinical Impressions(s) / ED Diagnoses   Final diagnoses:  Acute pain of left knee    New Prescriptions Discharge Medication List as of 10/12/2016  4:19 PM    START taking these medications   Details  meloxicam (MOBIC) 7.5 MG tablet Take 1 tablet (7.5 mg total) by mouth daily as needed for pain., Starting Thu 10/12/2016, Print    methocarbamol (ROBAXIN) 500 MG tablet Take 1-2 tablets (500-1,000 mg total) by mouth every 6 (six) hours as needed for muscle spasms (and pain)., Starting Thu 10/12/2016, [address]Print         Arkeem Harts, SmithfieldEmily, PA-C 10/12/16 1648    Tegeler, Canary Brimhristopher J, MD 10/13/16 [phone]

## 2017-09-03 ENCOUNTER — Emergency Department (HOSPITAL_BASED_OUTPATIENT_CLINIC_OR_DEPARTMENT_OTHER)
Admission: EM | Admit: 2017-09-03 | Discharge: 2017-09-03 | Disposition: A | Discharge status: Home / Self Care | Payer: BLUE CROSS/BLUE SHIELD | Attending: Emergency Medicine | Admitting: Emergency Medicine

## 2017-09-03 ENCOUNTER — Encounter (HOSPITAL_BASED_OUTPATIENT_CLINIC_OR_DEPARTMENT_OTHER): Payer: Self-pay | Admitting: Emergency Medicine

## 2017-09-03 ENCOUNTER — Other Ambulatory Visit: Payer: Self-pay

## 2017-09-03 DIAGNOSIS — Z79899 Other long term (current) drug therapy: Secondary | ICD-10-CM | POA: Insufficient documentation

## 2017-09-03 DIAGNOSIS — Z87891 Personal history of nicotine dependence: Secondary | ICD-10-CM | POA: Diagnosis not present

## 2017-09-03 DIAGNOSIS — L509 Urticaria, unspecified: Secondary | ICD-10-CM

## 2017-09-03 MED ORDER — DIPHENHYDRAMINE HCL 25 MG PO CAPS
25.0000 mg | ORAL_CAPSULE | Freq: Once | ORAL | Status: AC
  Administered 2017-09-03: 25 mg via ORAL
  Filled 2017-09-03: qty 1

## 2017-09-03 MED ORDER — METHYLPREDNISOLONE SODIUM SUCC 125 MG IJ SOLR
125.0000 mg | Freq: Once | INTRAMUSCULAR | Status: AC
  Administered 2017-09-03: 125 mg via INTRAMUSCULAR
  Filled 2017-09-03: qty 2

## 2017-09-03 MED ORDER — FAMOTIDINE 20 MG PO TABS
20.0000 mg | ORAL_TABLET | Freq: Once | ORAL | Status: AC
  Administered 2017-09-03: 20 mg via ORAL
  Filled 2017-09-03: qty 1

## 2017-09-03 MED ORDER — PREDNISONE 20 MG PO TABS: ORAL_TABLET | ORAL | 0 refills | Status: DC

## 2017-09-03 NOTE — ED Provider Notes (Signed)
[facility] EMERGENCY DEPARTMENT Provider Note   CSN: [id number] Arrival date & time: 09/03/17  [id number]     History   Chief Complaint Chief Complaint  Patient presents with  . Urticaria    HPI Gabriel Myers is a 32 y.o. male.  HPI Patient states he frequently gets hives.  Developed hives yesterday evening while in bed.  States he tried an antibiotic cream to alleviate symptoms.  Endorses itching but denies any difficulty breathing or intraoral swelling.  No new known exposures.  Patient has never seen an allergist. History reviewed. No pertinent past medical history.  Patient Active Problem List   Diagnosis Date Noted  . Right leg pain 01/02/2014  . Right knee injury 04/22/2013    History reviewed. No pertinent surgical history.      Home Medications    Prior to Admission medications   Medication Sig Start Date End Date Taking? Authorizing Provider  alum & mag hydroxide-simeth (MYLANTA) 200-200-20 MG/5ML suspension Take 10 mLs by mouth every 6 (six) hours as needed for indigestion. 05/25/12   Emilia BeckSzekalski, Kaitlyn, PA-C  ibuprofen (ADVIL,MOTRIN) 800 MG tablet Take 1 tablet (800 mg total) by mouth 3 (three) times daily. 03/20/15   Mackuen, Courteney Lyn, MD  meloxicam (MOBIC) 7.5 MG tablet Take 1 tablet (7.5 mg total) by mouth daily as needed for pain. 10/12/16   Trixie DredgeWest, Emily, PA-C  methocarbamol (ROBAXIN) 500 MG tablet Take 1-2 tablets (500-1,000 mg total) by mouth every 6 (six) hours as needed for muscle spasms (and pain). 10/12/16   Trixie DredgeWest, Emily, PA-C  predniSONE (DELTASONE) 20 MG tablet 3 tabs po day one, then 2 po daily x 4 days 09/04/17   Loren RacerYelverton, Brock Mokry, MD    Family History Family History  Problem Relation Age of Onset  . Sudden death Neg Hx   . Hypertension Neg Hx   . Hyperlipidemia Neg Hx   . Heart attack Neg Hx   . Diabetes Neg Hx     Social History Social History   Tobacco Use  . Smoking status: Former Smoker    Types: Cigarettes  . Smokeless  tobacco: Never Used  Substance Use Topics  . Alcohol use: No  . Drug use: No     Allergies   Patient has no known allergies.   Review of Systems Review of Systems  Constitutional: Negative for chills and fever.  HENT: Negative for facial swelling, sore throat and trouble swallowing.   Respiratory: Negative for cough, shortness of breath and wheezing.   Cardiovascular: Negative for chest pain, palpitations and leg swelling.  Gastrointestinal: Negative for abdominal pain, nausea and vomiting.  Musculoskeletal: Negative for back pain, myalgias and neck pain.  Skin: Positive for rash.  Neurological: Negative for dizziness, weakness, light-headedness, numbness and headaches.  All other systems reviewed and are negative.    Physical Exam Updated Vital Signs BP (!) 139/93   Pulse 90   Temp 98.9 F (37.2 C) (Oral)   Resp 18   Ht 5\' 6"  (1.676 m)   Wt 60.3 kg (133 lb)   SpO2 100%   BMI 21.47 kg/m   Physical Exam  Constitutional: He is oriented to person, place, and time. He appears well-developed and well-nourished. No distress.  HENT:  Head: Normocephalic and atraumatic.  Mouth/Throat: Oropharynx is clear and moist. No oropharyngeal exudate.  Eyes: Pupils are equal, round, and reactive to light. EOM are normal.  Neck: Normal range of motion. Neck supple.  No stridor  Cardiovascular: Normal rate and regular  rhythm. Exam reveals no gallop and no friction rub.  No murmur heard. Pulmonary/Chest: Effort normal and breath sounds normal. No stridor. No respiratory distress. He has no wheezes. He has no rales. He exhibits no tenderness.  Abdominal: Soft. Bowel sounds are normal. There is no tenderness. There is no rebound and no guarding.  Musculoskeletal: Normal range of motion. He exhibits no edema or tenderness.  Neurological: He is alert and oriented to person, place, and time.  Skin: Skin is warm and dry. Capillary refill takes less than 2 seconds. Rash noted. He is not  diaphoretic. No erythema.  Raised erythematous plaques to bilateral arms and patient's abdomen.   Psychiatric: He has a normal mood and affect. His behavior is normal.  Nursing note and vitals reviewed.    ED Treatments / Results  Labs (all labs ordered are listed, but only abnormal results are displayed) Labs Reviewed - No data to display  EKG None  Radiology No results found.  Procedures Procedures (including critical care time)  Medications Ordered in ED Medications  methylPREDNISolone sodium succinate (SOLU-MEDROL) 125 mg/2 mL injection 125 mg (125 mg Intramuscular Given 09/03/17 0000)  famotidine (PEPCID) tablet 20 mg (20 mg Oral Given 09/03/17 0734)  diphenhydrAMINE (BENADRYL) capsule 25 mg (25 mg Oral Given 09/03/17 0734)     Initial Impression / Assessment and Plan / ED Course  I have reviewed the triage vital signs and the nursing notes.  Pertinent labs & imaging results that were available during my care of the patient were reviewed by me and considered in my medical decision making (see chart for details).     Exam consistent with urticaria.  No airway compromise.  Will treat and reevaluate. Hives have almost completely resolved.  Continues to have no airway compromise.  Will discharge home with short course of prednisone.  Encouraged as needed Benadryl use.  Also encouraged him to follow-up with allergist.  Return precautions have been given. Final Clinical Impressions(s) / ED Diagnoses   Final diagnoses:  Urticaria    ED Discharge Orders        Ordered    predniSONE (DELTASONE) 20 MG tablet     09/03/17 0900       Loren Racer, MD 09/03/17 0900

## 2017-09-03 NOTE — ED Notes (Signed)
ED Provider at bedside. 

## 2017-09-03 NOTE — ED Triage Notes (Addendum)
Pt c/o generalized hives. Unsure of what the cause is. States this has happened before but doesn't usually last this long. Denies new food, or products. Denies SOB, itchy or tight throat. Pt did not try any tx at home.

## 2017-10-05 ENCOUNTER — Other Ambulatory Visit: Payer: Self-pay

## 2017-10-05 ENCOUNTER — Encounter: Payer: Self-pay | Admitting: Allergy & Immunology

## 2017-10-05 ENCOUNTER — Ambulatory Visit (INDEPENDENT_AMBULATORY_CARE_PROVIDER_SITE_OTHER): Payer: BLUE CROSS/BLUE SHIELD | Admitting: Allergy & Immunology

## 2017-10-05 VITALS — BP 112/70 | HR 80 | Temp 98.2°F | Resp 18 | Ht 66.0 in | Wt 131.2 lb

## 2017-10-05 DIAGNOSIS — L508 Other urticaria: Secondary | ICD-10-CM | POA: Diagnosis not present

## 2017-10-05 DIAGNOSIS — R05 Cough: Secondary | ICD-10-CM | POA: Diagnosis not present

## 2017-10-05 DIAGNOSIS — R059 Cough, unspecified: Secondary | ICD-10-CM

## 2017-10-05 MED ORDER — CETIRIZINE HCL 10 MG PO TABS: 10.0000 mg | ORAL_TABLET | Freq: Every day | ORAL | 5 refills | Status: DC

## 2017-10-05 MED ORDER — MONTELUKAST SODIUM 10 MG PO TABS: 10.0000 mg | ORAL_TABLET | Freq: Every day | ORAL | 5 refills | Status: DC

## 2017-10-05 NOTE — Patient Instructions (Addendum)
1. Chronic urticaria - Your history does not have any "red flags" such as fevers, joint pains, or permanent skin changes that would be concerning for a more serious cause of hives.  - We will get some labs to rule out serious causes of hives: complete blood count, tryptase level, alpha gal panel, chronic urticaria panel, CMP, ESR, and CRP. - We will also get an environmental allergy panel to look for allergies in your blood.  - Often times we never figure out what is causing the hives, but we will rule out some serious causes.  - In the meantime, start suppressive dosing of antihistamines:   - Morning: Zyrtec (cetirizine) 10-70m (one to two tablets)  - Evening: Zyrtec (cetirizine) 10-283m(one to two tablets) + Singulair (montelukast) 1053m You can change this dosing at home, decreasing the dose as needed or increasing the dosing as needed.  - If you are not tolerating the medications or are tired of taking them every day, we can start treatment with a monthly injectable medication called Xolair.   2. Return in about 3 months (around 01/05/2018).   Please inform us [place] any Emergency Department visits, hospitalizations, or changes in symptoms. Call us [place] going to the ED for breathing or allergy symptoms since we might be able to fit you in for a sick visit. Feel free to contact us [place] with any questions, problems, or concerns.  It was a pleasure to meet you today!  Websites that have reliable patient information: 1. American Academy of Asthma, Allergy, and Immunology: www.aaaai.org 2. Food Allergy Research and Education (FARE): foodallergy.org 3. Mothers of Asthmatics: http://www.asthmacommunitynetwork.org 4. American College of Allergy, Asthma, and Immunology: wwwMonthlyElectricBill.co.uk Make sure you are registered to vote!

## 2017-10-05 NOTE — Addendum Note (Signed)
Addended by: Alfonse Spruce on: 10/05/2017 02:50 PM   Modules accepted: Orders

## 2017-10-05 NOTE — Progress Notes (Addendum)
NEW PATIENT  Date of Service/Encounter:  10/05/17  Referring provider: Patient, No Pcp Per   Assessment:   Chronic urticaria - unknown trigger  Cough  Plan/Recommendations:   1. Chronic urticaria - Your history does not have any "red flags" such as fevers, joint pains, or permanent skin changes that would be concerning for a more serious cause of hives.  - We will get some labs to rule out serious causes of hives: complete blood count, tryptase level, alpha gal panel, chronic urticaria panel, CMP, ESR, and CRP. - We will also get an environmental allergy panel to look for allergies in your blood.  - Often times we never figure out what is causing the hives, but we will rule out some serious causes.  - In the meantime, start suppressive dosing of antihistamines:   - Morning: Zyrtec (cetirizine) 10-30m (one to two tablets)  - Evening: Zyrtec (cetirizine) 10-240m(one to two tablets) + Singulair (montelukast) 1076m You can change this dosing at home, decreasing the dose as needed or increasing the dosing as needed.  - If you are not tolerating the medications or are tired of taking them every day, we can start treatment with a monthly injectable medication called Xolair.   2. Cough - Lung testing was normal. - There is no need for a controller medication at this time.   3. Return in about 3 months (around 01/05/2018).  Subjective:   Gabriel Myers a 31 1o. male presenting today for evaluation of  Chief Complaint  Patient presents with  . Urticaria    hives, dypnea when he runs    Gabriel Myers a history of the following: Patient Active Problem List   Diagnosis Date Noted  . Right leg pain 01/02/2014  . Right knee injury 04/22/2013    History obtained from: chart review and patient .  Gabriel Myers referred by Patient, No Pcp Per.     Gabriel Myers a 31 8o. male presenting for an evaluation of urticaria as well as concern for asthma.   Urticaria Symptom  History: He has had hives since his early 20s28shese seem to be triggered by various things including getting out of the shower. He does think that it might be related to foods. One Sunday he ate a can of Vienna sausage which resulted in hives in the middle of the night. He went to the ED and he was given a steroid injection. He was monitored for one hour. He did have his throat evaluated and he does report some voice changes going "in and out". He sometimes has problems with being understood. He takes Benadryl with improvement in his symptoms. He does treat it with an OTC antibiotic (noepsorin) which seems to help. They stick around for around 3-4 minutes before improving. It leaves normal skin when they resolve. There are no fevers or joint pain. He does eat red meat on a regular basis. He denies tick bites.   Allergic Rhinitis Symptom History: He does have very are itching of his eye. Otherwise he has no problems with allergic rhinitis symptoms. He does not think that there are animals that make his hives worse. He denies nasal congestion, sneezing, or postnasal drip. He has never been allergy tested in the past.   Asthma/Respiratory Symptom History: He has never been diagnosed with asthma. However, he will "hear a weird noise". He does have SOB when he works out. He has never had an inhaler. 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 He works between 20-25 hours at [employer] and then 40 hours at [employer] job.   Otherwise, there is no history of other atopic diseases, including asthma, drug allergies, food allergies, or stinging insect allergies. There is no significant infectious history. Vaccinations are up to date.    Past Medical History: Patient Active Problem List   Diagnosis Date Noted  . Right leg pain 01/02/2014  . Right knee injury 04/22/2013    Medication List:  Allergies as of 10/05/2017   No Known Allergies     Medication List        Accurate as  of 10/05/17  2:48 PM. Always use your most recent med list.          alum & mag hydroxide-simeth 200-200-20 MG/5ML suspension Commonly known as:  MYLANTA Take 10 mLs by mouth every 6 (six) hours as needed for indigestion.   diphenhydrAMINE 25 mg capsule Commonly known as:  BENADRYL Take 25 mg by mouth every 6 (six) hours as needed.   ibuprofen 800 MG tablet Commonly known as:  ADVIL,MOTRIN Take 1 tablet (800 mg total) by mouth 3 (three) times daily.   meloxicam 7.5 MG tablet Commonly known as:  MOBIC Take 1 tablet (7.5 mg total) by mouth daily as needed for pain.   methocarbamol 500 MG tablet Commonly known as:  ROBAXIN Take 1-2 tablets (500-1,000 mg total) by mouth every 6 (six) hours as needed for muscle spasms (and pain).   predniSONE 20 MG tablet Commonly known as:  DELTASONE 3 tabs po day one, then 2 po daily x 4 days       Birth History: non-contributory.   Developmental History: non-contributory.   Past Surgical History: History reviewed. No pertinent surgical history.   Family History: Family History  Problem Relation Age of Onset  . Asthma Cousin   . Diabetes Cousin   . Urticaria Mother   . Allergic rhinitis Sister   . Sudden death Neg Hx   . Hypertension Neg Hx   . Hyperlipidemia Neg Hx   . Heart attack Neg Hx      Social History: Galo lives at home with his dog who is four years of age. He lives in a rented house since January 2019. There is gas heating and central cooling. There are not dust mite coverings in the bedding or pillows. She works at a Hospital doctor for [employer] and she works at [employer]. There is no tobacco exposure.     Review of Systems: a 14-point review of systems is pertinent for what is mentioned in HPI.  Otherwise, all other systems were negative. Constitutional: negative other than that listed in the HPI Eyes: negative other than that listed in the HPI Ears, nose, mouth, throat, and face: negative other than that listed  in the HPI Respiratory: negative other than that listed in the HPI Cardiovascular: negative other than that listed in the HPI Gastrointestinal: negative other than that listed in the HPI Genitourinary: negative other than that listed in the HPI Integument: negative other than that listed in the HPI Hematologic: negative other than that listed in the HPI Musculoskeletal: negative other than that listed in the HPI Neurological: negative other than that listed in the HPI Allergy/Immunologic: negative other than that listed in the HPI    Objective:   Blood pressure 112/70, pulse 80, temperature 98.2 F (36.8 C), temperature source Oral, resp. rate 18, height '5\' 6"'  (  1.676 m), weight 131 lb 3.2 oz (59.5 kg). Body mass index is 21.18 kg/m.   Physical Exam:  General: Alert, interactive, in no acute distress. Pleasant.  Eyes: No conjunctival injection bilaterally, no discharge on the right, no discharge on the left and no Horner-Trantas dots present. PERRL bilaterally. EOMI without pain. No photophobia.  Ears: Right TM pearly gray with normal light reflex, Left TM pearly gray with normal light reflex, Right TM intact without perforation and Left TM intact without perforation.  Nose/Throat: External nose within normal limits and septum midline. Turbinates edematous and pale with clear discharge. Posterior oropharynx erythematous without cobblestoning in the posterior oropharynx. Tonsils 2+ without exudates.  Tongue without thrush. Neck: Supple without thyromegaly. Trachea midline. Adenopathy: no enlarged lymph nodes appreciated in the anterior cervical, occipital, axillary, epitrochlear, inguinal, or popliteal regions. Lungs: Clear to auscultation without wheezing, rhonchi or rales. No increased work of breathing. CV: Normal S1/S2. No murmurs. Capillary refill <2 seconds.  Abdomen: Nondistended, nontender. No guarding or rebound tenderness. Bowel sounds present in all fields and hypoactive    Skin: Scattered erythematous urticarial type lesions primarily located bilateral arms (gone by the time that I left the room) , nonvesicular. Extremities:  No clubbing, cyanosis or edema. Neuro:   Grossly intact. No focal deficits appreciated. Responsive to questions.  Diagnostic studies:  Spirometry: results normal (FEV1: 3.24/97%, FVC: 3.78/95%, FEV1/FVC: 86%).    Spirometry consistent with normal pattern.   Allergy Studies: none      Salvatore Marvel, MD [facility]

## 2017-10-14 LAB — CBC WITH DIFFERENTIAL/PLATELET
BASOS ABS: 0 10*3/uL (ref 0.0–0.2)
Basos: 1 %
EOS (ABSOLUTE): 0.1 10*3/uL (ref 0.0–0.4)
Eos: 3 %
HEMOGLOBIN: 14.7 g/dL (ref 13.0–17.7)
Hematocrit: 44.6 % (ref 37.5–51.0)
Immature Grans (Abs): 0 10*3/uL (ref 0.0–0.1)
Immature Granulocytes: 0 %
LYMPHS ABS: 1.5 10*3/uL (ref 0.7–3.1)
Lymphs: 44 %
MCH: 27.9 pg (ref 26.6–33.0)
MCHC: 33 g/dL (ref 31.5–35.7)
MCV: 85 fL (ref 79–97)
Monocytes Absolute: 0.2 10*3/uL (ref 0.1–0.9)
Monocytes: 6 %
NEUTROS PCT: 46 %
Neutrophils Absolute: 1.6 10*3/uL (ref 1.4–7.0)
PLATELETS: 158 10*3/uL (ref 150–450)
RBC: 5.26 x10E6/uL (ref 4.14–5.80)
RDW: 14.4 % (ref 12.3–15.4)
WBC: 3.5 10*3/uL (ref 3.4–10.8)

## 2017-10-14 LAB — CMP14+EGFR
ALBUMIN: 3.9 g/dL (ref 3.5–5.5)
ALK PHOS: 31 IU/L — AB (ref 39–117)
ALT: 17 IU/L (ref 0–44)
AST: 24 IU/L (ref 0–40)
Albumin/Globulin Ratio: 2.2 (ref 1.2–2.2)
BUN/Creatinine Ratio: 11 (ref 9–20)
BUN: 12 mg/dL (ref 6–20)
Bilirubin Total: 0.3 mg/dL (ref 0.0–1.2)
CHLORIDE: 108 mmol/L — AB (ref 96–106)
CO2: 24 mmol/L (ref 20–29)
Calcium: 8.8 mg/dL (ref 8.7–10.2)
Creatinine, Ser: 1.1 mg/dL (ref 0.76–1.27)
GFR calc Af Amer: 102 mL/min/{1.73_m2} (ref >59)
GFR calc non Af Amer: 88 mL/min/{1.73_m2} (ref >59)
GLUCOSE: 81 mg/dL (ref 65–99)
Globulin, Total: 1.8 g/dL (ref 1.5–4.5)
Potassium: 4.2 mmol/L (ref 3.5–5.2)
Sodium: 143 mmol/L (ref 134–144)
Total Protein: 5.7 g/dL — ABNORMAL LOW (ref 6.0–8.5)

## 2017-10-14 LAB — IGE+ALLERGENS ZONE 2(30)
Aspergillus Fumigatus IgE: <0.1 kU/L
Bahia Grass IgE: <0.1 kU/L
Cockroach, American IgE: <0.1 kU/L
Common Silver Birch IgE: <0.1 kU/L
D Pteronyssinus IgE: <0.1 kU/L
Elm, American IgE: <0.1 kU/L
IgE (Immunoglobulin E), Serum: 4 IU/mL — ABNORMAL LOW (ref 6–495)
Johnson Grass IgE: <0.1 kU/L
Mucor Racemosus IgE: <0.1 kU/L
Mugwort IgE Qn: <0.1 kU/L
Pigweed, Rough IgE: <0.1 kU/L
Plantain, English IgE: <0.1 kU/L
Ragweed, Short IgE: <0.1 kU/L
Stemphylium Herbarum IgE: <0.1 kU/L
Timothy Grass IgE: <0.1 kU/L

## 2017-10-14 LAB — HIGH SENSITIVITY CRP: CRP HIGH SENSITIVITY: 0.32 mg/L (ref 0.00–3.00)

## 2017-10-14 LAB — ALPHA-GAL PANEL
Alpha Gal IgE*: <0.1 kU/L (ref <0.10)
BEEF CLASS INTERPRETATION: 0
Class Interpretation: 0
LAMB CLASS INTERPRETATION: 0
Lamb/Mutton (Ovis spp) IgE: <0.1 kU/L (ref <0.35)
Pork (Sus spp) IgE: <0.1 kU/L (ref <0.35)

## 2017-10-14 LAB — TRYPTASE: TRYPTASE: 8.4 ug/L (ref 2.2–13.2)

## 2017-10-14 LAB — CHRONIC URTICARIA: cu index: <1 (ref <10)

## 2017-10-14 LAB — SEDIMENTATION RATE: SED RATE: 2 mm/h (ref 0–15)

## 2018-01-12 ENCOUNTER — Emergency Department (HOSPITAL_BASED_OUTPATIENT_CLINIC_OR_DEPARTMENT_OTHER)
Admission: EM | Admit: 2018-01-12 | Discharge: 2018-01-12 | Disposition: A | Discharge status: Home / Self Care | Payer: BLUE CROSS/BLUE SHIELD | Attending: Emergency Medicine | Admitting: Emergency Medicine

## 2018-01-12 ENCOUNTER — Encounter (HOSPITAL_BASED_OUTPATIENT_CLINIC_OR_DEPARTMENT_OTHER): Payer: Self-pay | Admitting: *Deleted

## 2018-01-12 ENCOUNTER — Other Ambulatory Visit: Payer: Self-pay

## 2018-01-12 DIAGNOSIS — Z113 Encounter for screening for infections with a predominantly sexual mode of transmission: Secondary | ICD-10-CM | POA: Insufficient documentation

## 2018-01-12 DIAGNOSIS — Z87891 Personal history of nicotine dependence: Secondary | ICD-10-CM | POA: Insufficient documentation

## 2018-01-12 DIAGNOSIS — Z114 Encounter for screening for human immunodeficiency virus [HIV]: Secondary | ICD-10-CM | POA: Insufficient documentation

## 2018-01-12 DIAGNOSIS — Z202 Contact with and (suspected) exposure to infections with a predominantly sexual mode of transmission: Secondary | ICD-10-CM

## 2018-01-12 MED ORDER — AZITHROMYCIN 250 MG PO TABS
1000.0000 mg | ORAL_TABLET | Freq: Once | ORAL | Status: AC
  Administered 2018-01-12: 1000 mg via ORAL
  Filled 2018-01-12: qty 4

## 2018-01-12 MED ORDER — CEFTRIAXONE SODIUM 250 MG IJ SOLR
250.0000 mg | Freq: Once | INTRAMUSCULAR | Status: AC
  Administered 2018-01-12: 250 mg via INTRAMUSCULAR
  Filled 2018-01-12: qty 250

## 2018-01-12 MED ORDER — LIDOCAINE HCL (PF) 1 % IJ SOLN
INTRAMUSCULAR | Status: AC
  Administered 2018-01-12: 2 mL
  Filled 2018-01-12: qty 5

## 2018-01-12 NOTE — Discharge Instructions (Addendum)
Make sure your current partners are treated, please review the discharge instructions for additional information

## 2018-01-12 NOTE — ED Notes (Addendum)
Lab notified of HIV antibody add on

## 2018-01-12 NOTE — ED Provider Notes (Signed)
[facility] EMERGENCY DEPARTMENT Provider Note   CSN: [id number] Arrival date & time: 01/12/18  1026     History   Chief Complaint Chief Complaint  Patient presents with  . Exposure to STD    HPI David Rodriquez is a 32 y.o. male.  HPI Patient presents to the emergency room for evaluation of a possible chlamydia exposure.  Patient states his ex-girlfriend called him and told him that she had chlamydia and that he gave it to her.  The last time he had intercourse with her was over 3 months ago.  Patient denies having any symptoms.  He denies any dysuria or penile discharge.  He denies any fevers or chills.  No complaints of abdominal pain, vomiting or diarrhea.  Patient has been sexually active with his current girlfriend since. Past Medical History:  Diagnosis Date  . Urticaria     Patient Active Problem List   Diagnosis Date Noted  . Right leg pain 01/02/2014  . Right knee injury 04/22/2013    History reviewed. No pertinent surgical history.      Home Medications    Prior to Admission medications   Medication Sig Start Date End Date Taking? Authorizing Provider  cetirizine (ZYRTEC ALLERGY) 10 MG tablet Take 1 tablet (10 mg total) by mouth daily. 10/05/17   Alfonse Spruce, MD  diphenhydrAMINE (BENADRYL) 25 mg capsule Take 25 mg by mouth every 6 (six) hours as needed.    [provider]  ibuprofen (ADVIL,MOTRIN) 800 MG tablet Take 1 tablet (800 mg total) by mouth 3 (three) times daily. Patient not taking: Reported on 10/05/2017 03/20/15   Mackuen, Courteney Lyn, MD  methocarbamol (ROBAXIN) 500 MG tablet Take 1-2 tablets (500-1,000 mg total) by mouth every 6 (six) hours as needed for muscle spasms (and pain). Patient not taking: Reported on 10/05/2017 10/12/16   Trixie Dredge, PA-C    Family History Family History  Problem Relation Age of Onset  . Asthma Cousin   . Diabetes Cousin   . Urticaria Mother   . Allergic rhinitis Sister   . Sudden death  Neg Hx   . Hypertension Neg Hx   . Hyperlipidemia Neg Hx   . Heart attack Neg Hx     Social History Social History   Tobacco Use  . Smoking status: Former Smoker    Types: Cigarettes  . Smokeless tobacco: Never Used  Substance Use Topics  . Alcohol use: No  . Drug use: No     Allergies   Patient has no known allergies.   Review of Systems Review of Systems  All other systems reviewed and are negative.    Physical Exam Updated Vital Signs BP 136/77 (BP Location: Left Arm)   Pulse 74   Temp 99.1 F (37.3 C) (Oral)   Resp (!) 77   Ht 1.676 m (5\' 6" )   Wt 61.2 kg   SpO2 100%   BMI 21.79 kg/m   Physical Exam  Constitutional: He appears well-developed and well-nourished. No distress.  HENT:  Head: Normocephalic and atraumatic.  Right Ear: External ear normal.  Left Ear: External ear normal.  Eyes: Conjunctivae are normal. Right eye exhibits no discharge. Left eye exhibits no discharge. No scleral icterus.  Neck: Neck supple. No tracheal deviation present.  Cardiovascular: Normal rate.  Pulmonary/Chest: Effort normal. No stridor. No respiratory distress.  Abdominal: He exhibits no distension.  Musculoskeletal: He exhibits no edema.  Neurological: He is alert. Cranial nerve deficit: no gross deficits.  Skin: Skin is warm and dry. No rash noted.  Psychiatric: He has a normal mood and affect.  Nursing note and vitals reviewed.    ED Treatments / Results  Labs (all labs ordered are listed, but only abnormal results are displayed) Labs Reviewed  RPR  HIV ANTIBODY (ROUTINE TESTING)    EKG None  Radiology No results found.  Procedures Procedures (including critical care time)  Medications Ordered in ED Medications  cefTRIAXone (ROCEPHIN) injection 250 mg (250 mg Intramuscular Given 01/12/18 1114)  azithromycin (ZITHROMAX) tablet 1,000 mg (1,000 mg Oral Given 01/12/18 1113)  lidocaine (PF) (XYLOCAINE) 1 % injection (2 mLs  Given 01/12/18 1115)      Initial Impression / Assessment and Plan / ED Course  I have reviewed the triage vital signs and the nursing notes.  Pertinent labs & imaging results that were available during my care of the patient were reviewed by me and considered in my medical decision making (see chart for details).    Patient presented to the emergency room for possible STD exposure.  Patient was asymptomatic.  Chlamydia and gonorrhea testing was deferred.  He was treated empirically for possible exposure.  HIV and RPR testing was also sent off for STD screening.  Counseled patient on having his current partners treated.  Final Clinical Impressions(s) / ED Diagnoses   Final diagnoses:  STD exposure    ED Discharge Orders    None       Linwood DibblesKnapp, Caedin Mogan, MD 01/12/18 1157

## 2018-01-12 NOTE — ED Triage Notes (Signed)
Pt request STD check, denies symptoms

## 2018-01-12 NOTE — ED Notes (Signed)
Provided pt with crackers and ginger-ale

## 2018-01-12 NOTE — ED Notes (Addendum)
Pt states that a previous partner has been diagnosed with chlamydia. He states he has not has sexual intercourse with previous partner in the last 3 months.

## 2018-01-13 LAB — HIV ANTIBODY (ROUTINE TESTING W REFLEX): HIV SCREEN 4TH GENERATION: NONREACTIVE

## 2018-01-13 LAB — RPR: RPR: NONREACTIVE

## 2018-04-19 ENCOUNTER — Encounter (HOSPITAL_BASED_OUTPATIENT_CLINIC_OR_DEPARTMENT_OTHER): Payer: Self-pay

## 2018-04-19 ENCOUNTER — Other Ambulatory Visit: Payer: Self-pay

## 2018-04-19 ENCOUNTER — Emergency Department (HOSPITAL_BASED_OUTPATIENT_CLINIC_OR_DEPARTMENT_OTHER): Payer: Managed Care, Other (non HMO)

## 2018-04-19 ENCOUNTER — Emergency Department (HOSPITAL_BASED_OUTPATIENT_CLINIC_OR_DEPARTMENT_OTHER)
Admission: EM | Admit: 2018-04-19 | Discharge: 2018-04-19 | Disposition: A | Discharge status: Home / Self Care | Payer: Managed Care, Other (non HMO) | Attending: Emergency Medicine | Admitting: Emergency Medicine

## 2018-04-19 DIAGNOSIS — Y92818 Other transport vehicle as the place of occurrence of the external cause: Secondary | ICD-10-CM | POA: Insufficient documentation

## 2018-04-19 DIAGNOSIS — W2209XA Striking against other stationary object, initial encounter: Secondary | ICD-10-CM | POA: Diagnosis not present

## 2018-04-19 DIAGNOSIS — Z87891 Personal history of nicotine dependence: Secondary | ICD-10-CM | POA: Diagnosis not present

## 2018-04-19 DIAGNOSIS — S8002XA Contusion of left knee, initial encounter: Secondary | ICD-10-CM | POA: Insufficient documentation

## 2018-04-19 DIAGNOSIS — Y990 Civilian activity done for income or pay: Secondary | ICD-10-CM | POA: Diagnosis not present

## 2018-04-19 DIAGNOSIS — Y939 Activity, unspecified: Secondary | ICD-10-CM | POA: Diagnosis not present

## 2018-04-19 DIAGNOSIS — Z79899 Other long term (current) drug therapy: Secondary | ICD-10-CM | POA: Diagnosis not present

## 2018-04-19 DIAGNOSIS — S8992XA Unspecified injury of left lower leg, initial encounter: Secondary | ICD-10-CM | POA: Diagnosis present

## 2018-04-19 MED ORDER — IBUPROFEN 800 MG PO TABS: 800.0000 mg | ORAL_TABLET | Freq: Three times a day (TID) | ORAL | 0 refills | Status: DC

## 2018-04-19 NOTE — Discharge Instructions (Signed)
It was my pleasure taking care of you today!   Please see the information and instructions below regarding your visit.  Your diagnoses today include:  1. Contusion of left knee, initial encounter     Tests performed today include: See side panel of your discharge paperwork for testing performed today. Vital signs are listed at the bottom of these instructions.   NO fracture.   Medications prescribed:    Take any prescribed medications only as prescribed, and any over the counter medications only as directed on the packaging.  Ibuprofen as needed for pain.   You are prescribed ibuprofen, a non-steroidal anti-inflammatory agent (NSAID) for pain. You may take 800 mg every 8  hours as needed for pain. If still requiring this medication around the clock for acute pain after 10 days, please see your primary healthcare provider.  Women who are pregnant, breastfeeding, or planning on becoming pregnant should not take non-steroidal anti-inflammatories such as Advil and Aleve. Tylenol is a safe over the counter pain reliever in pregnant women.  You may combine this medication with Tylenol, 650 mg every 6 hours, so you are receiving something for pain every 3 hours.  This is not a long-term medication unless under the care and direction of your primary provider. Taking this medication long-term and not under the supervision of a healthcare provider could increase the risk of stomach ulcers, kidney problems, and cardiovascular problems such as high blood pressure.    Home care instructions:  Please follow any educational materials contained in this packet.   Follow-up instructions: Please follow-up with your primary care provider in one week for further evaluation of your symptoms if they are not completely improved.   Call the orthopedist listed today or tomorrow to schedule follow up appointment for recheck of ongoing knee pain in one to two weeks. That appointment can be canceled with a  24-48 hour notice if complete resolution of pain.   Follow up with the orthopedist listed if symptoms do not improve in one week.   Return instructions:  Please return to the Emergency Department if you experience worsening symptoms.  Please return for any increasing swelling, increasing pain, loss of color to the lower leg, or increasing redness. Please return if you have any other emergent concerns.  Additional Information:   Your vital signs today were: BP 124/76 (BP Location: Left Arm)    Pulse 73    Temp 97.8 F (36.6 C) (Oral)    Resp 18    Ht 5\' 7"  (1.702 m)    Wt 62.6 kg    SpO2 100%    BMI 21.61 kg/m  If your blood pressure (BP) was elevated on multiple readings during this visit above 130 for the top number or above 80 for the bottom number, please have this repeated by your primary care provider within one month. --------------

## 2018-04-19 NOTE — ED Provider Notes (Signed)
[facility] EMERGENCY DEPARTMENT Provider Note   CSN: [id number] Arrival date & time: 04/19/18  1600     History   Chief Complaint Chief Complaint  Patient presents with  . Knee Injury    HPI Gabriel Myers is a 32 y.o. male.  HPI  Patient is a 32 year old male with no significant past medical history presenting for left knee injury.  Patient reports that this morning around 7 AM, he hit his anterior left knee on his work [employer].  He reports that there is a mild amount of swelling at the time.  He reports that he has had residual pain with walking on the knee.  Patient denies any loss of sensation distally.  Denies any weakness of the lower extremity.  Denies any open wound.  Past Medical History:  Diagnosis Date  . Urticaria     Patient Active Problem List   Diagnosis Date Noted  . Right leg pain 01/02/2014  . Right knee injury 04/22/2013    History reviewed. No pertinent surgical history.      Home Medications    Prior to Admission medications   Medication Sig Start Date End Date Taking? Authorizing Provider  cetirizine (ZYRTEC ALLERGY) 10 MG tablet Take 1 tablet (10 mg total) by mouth daily. 10/05/17   Alfonse Spruce, MD  diphenhydrAMINE (BENADRYL) 25 mg capsule Take 25 mg by mouth every 6 (six) hours as needed.    [provider]  ibuprofen (ADVIL,MOTRIN) 800 MG tablet Take 1 tablet (800 mg total) by mouth 3 (three) times daily. Patient not taking: Reported on 10/05/2017 03/20/15   Mackuen, Courteney Lyn, MD  methocarbamol (ROBAXIN) 500 MG tablet Take 1-2 tablets (500-1,000 mg total) by mouth every 6 (six) hours as needed for muscle spasms (and pain). Patient not taking: Reported on 10/05/2017 10/12/16   Trixie Dredge, PA-C    Family History Family History  Problem Relation Age of Onset  . Asthma Cousin   . Diabetes Cousin   . Urticaria Mother   . Allergic rhinitis Sister   . Sudden death Neg Hx   . Hypertension Neg Hx   . Hyperlipidemia  Neg Hx   . Heart attack Neg Hx     Social History Social History   Tobacco Use  . Smoking status: Former Smoker    Types: Cigarettes  . Smokeless tobacco: Never Used  Substance Use Topics  . Alcohol use: No  . Drug use: No     Allergies   Patient has no known allergies.   Review of Systems Review of Systems  Musculoskeletal: Positive for arthralgias. Negative for joint swelling.  Skin: Negative for color change and wound.  Neurological: Negative for weakness and numbness.     Physical Exam Updated Vital Signs BP 124/76 (BP Location: Left Arm)   Pulse 73   Temp 97.8 F (36.6 C) (Oral)   Resp 18   Ht 5\' 7"  (1.702 m)   Wt 62.6 kg   SpO2 100%   BMI 21.61 kg/m   Physical Exam  Constitutional: He appears well-developed and well-nourished. No distress.  Sitting comfortably in bed.  HENT:  Head: Normocephalic and atraumatic.  Eyes: Conjunctivae are normal. Right eye exhibits no discharge. Left eye exhibits no discharge.  EOMs normal to gross examination.  Neck: Normal range of motion.  Cardiovascular: Normal rate and regular rhythm.  Intact, 2+ DP pulse.  Pulmonary/Chest:  Normal respiratory effort. Patient converses comfortably. No audible wheeze or stridor.  Abdominal: He exhibits no  distension.  Musculoskeletal: Normal range of motion.  Left knee with tenderness to palpation of patella. Full ROM. No joint line tenderness. No joint effusion or swelling appreciated. No abnormal alignment or patellar mobility. No bruising, erythema or warmth overlaying the joint. No varus/valgus laxity. Negative drawer's, Lachman's and McMurray's.  No crepitus.  2+ DP pulses bilaterally. All compartments are soft. Sensation intact distal to injury.  Neurological: He is alert.  Cranial nerves intact to gross observation. Patient moves extremities without difficulty.  Skin: Skin is warm and dry. He is not diaphoretic.  Psychiatric: He has a normal mood and affect. His behavior is  normal. Judgment and thought content normal.  Nursing note and vitals reviewed.    ED Treatments / Results  Labs (all labs ordered are listed, but only abnormal results are displayed) Labs Reviewed - No data to display  EKG None  Radiology Dg Knee Complete 4 Views Left  Result Date: 04/19/2018 CLINICAL DATA:  Patient hit knee on his truck this morning. Patellar pain. EXAM: LEFT KNEE - COMPLETE 4+ VIEW COMPARISON:  None. FINDINGS: No evidence of fracture, dislocation, or joint effusion. No evidence of arthropathy or other focal bone abnormality. Soft tissues are unremarkable. IMPRESSION: No acute osseous abnormality of the left. No joint dislocation or fracture. No joint effusion. Electronically Signed   By: Tollie Ethavid  Kwon M.D.   On: 04/19/2018 16:40    Procedures Procedures (including critical care time)  Medications Ordered in ED Medications - No data to display   Initial Impression / Assessment and Plan / ED Course  I have reviewed the triage vital signs and the nursing notes.  Pertinent labs & imaging results that were available during my care of the patient were reviewed by me and considered in my medical decision making (see chart for details).     Patient well-appearing and neurovascularly intact in the left lower extremity.  Patient with knee injury earlier today.  X-ray is negative for fracture.  Exam is unremarkable.  Suspect contusion.  Patient placed in knee sleeve, ambulatory, and instructed to perform RICE therapy and take NSAIDs.  Orthopedic follow-up provided.  Return precautions given for any increasing pain, swelling, pallor or paresthesias lower extremity.  Patient is in understanding and agrees with plan of care.  Final Clinical Impressions(s) / ED Diagnoses   Final diagnoses:  Contusion of left knee, initial encounter    ED Discharge Orders         Ordered    ibuprofen (ADVIL,MOTRIN) 800 MG tablet  3 times daily     04/19/18 1735           Delia ChimesMurray,  Other Atienza B, PA-C 04/19/18 1737    Derwood KaplanNanavati, Ankit, MD 04/20/18 [phone]

## 2018-04-19 NOTE — ED Triage Notes (Signed)
Pt states he hit his left knee on work truck this am-no break in skin-NAD-steady gait

## 2018-05-16 ENCOUNTER — Encounter: Payer: Self-pay | Admitting: Family Medicine

## 2018-05-16 ENCOUNTER — Ambulatory Visit (INDEPENDENT_AMBULATORY_CARE_PROVIDER_SITE_OTHER): Payer: Managed Care, Other (non HMO) | Admitting: Family Medicine

## 2018-05-16 VITALS — BP 129/71 | HR 72 | Ht 67.0 in | Wt 138.0 lb

## 2018-05-16 DIAGNOSIS — M25562 Pain in left knee: Secondary | ICD-10-CM | POA: Diagnosis not present

## 2018-05-16 NOTE — Progress Notes (Signed)
PCP: Patient, No Pcp Per  Subjective:   HPI: Patient is a 33 y.o. male here for left knee pain.  Patient reports about 1 month ago he was getting into his work truck when he struck his anterior left knee on the inside of the door. Immediate pain anterior knee about the patella, pain now down to 5/10. Had some swelling but this has improved. He's taking ibuprofen and using a knee brace which help. Getting sharp pains at nighttime. No skin changes, numbness.  Past Medical History:  Diagnosis Date  . Urticaria     Current Outpatient Medications on File Prior to Visit  Medication Sig Dispense Refill  . cetirizine (ZYRTEC ALLERGY) 10 MG tablet Take 1 tablet (10 mg total) by mouth daily. 30 tablet 5  . diphenhydrAMINE (BENADRYL) 25 mg capsule Take 25 mg by mouth every 6 (six) hours as needed.    Marland Kitchen ibuprofen (ADVIL,MOTRIN) 800 MG tablet Take 1 tablet (800 mg total) by mouth 3 (three) times daily. 21 tablet 0  . methocarbamol (ROBAXIN) 500 MG tablet Take 1-2 tablets (500-1,000 mg total) by mouth every 6 (six) hours as needed for muscle spasms (and pain). (Patient not taking: Reported on 10/05/2017) 15 tablet 0   No current facility-administered medications on file prior to visit.     History reviewed. No pertinent surgical history.  No Known Allergies  Social History   Socioeconomic History  . Marital status: Single    Spouse name: Not on file  . Number of children: Not on file  . Years of education: Not on file  . Highest education level: Not on file  Occupational History  . Not on file  Social Needs  . Financial resource strain: Not on file  . Food insecurity:    Worry: Not on file    Inability: Not on file  . Transportation needs:    Medical: Not on file    Non-medical: Not on file  Tobacco Use  . Smoking status: Former Smoker    Types: Cigarettes  . Smokeless tobacco: Never Used  Substance and Sexual Activity  . Alcohol use: No  . Drug use: No  . Sexual activity:  Not on file  Lifestyle  . Physical activity:    Days per week: Not on file    Minutes per session: Not on file  . Stress: Not on file  Relationships  . Social connections:    Talks on phone: Not on file    Gets together: Not on file    Attends religious service: Not on file    Active member of club or organization: Not on file    Attends meetings of clubs or organizations: Not on file    Relationship status: Not on file  . Intimate partner violence:    Fear of current or ex partner: Not on file    Emotionally abused: Not on file    Physically abused: Not on file    Forced sexual activity: Not on file  Other Topics Concern  . Not on file  Social History Narrative  . Not on file    Family History  Problem Relation Age of Onset  . Asthma Cousin   . Diabetes Cousin   . Urticaria Mother   . Allergic rhinitis Sister   . Sudden death Neg Hx   . Hypertension Neg Hx   . Hyperlipidemia Neg Hx   . Heart attack Neg Hx     BP 129/71   Pulse 72  Ht 5\' 7"  (1.702 m)   Wt 138 lb (62.6 kg)   BMI 21.61 kg/m   Review of Systems: See HPI above.     Objective:  Physical Exam:  Gen: NAD, comfortable in exam room  Left knee: No gross deformity, ecchymoses, swelling. TTP post patellar facets.  No other tenderness. FROM with 5/5 strength flexion and extension. Negative ant/post drawers. Negative valgus/varus testing. Negative lachmans. Negative mcmurrays, apleys, patellar apprehension. NV intact distally.  Right knee: No deformity. FROM with 5/5 strength. No tenderness to palpation. NVI distally.   MSK u/s left knee: trace effusion.  Quad and patellar tendons intact.  Assessment & Plan:  1. Left knee pain - 2/2 contusion.  Exam reassuring.  Independently reviewed radiographs and no evidence fracture.  Icing, ibuprofen or aleve.  Shown home exercises to do daily.  Knee brace for support.  F/u in 6-8 weeks or prn if doing well.

## 2018-05-16 NOTE — Patient Instructions (Signed)
You have a knee contusion. Ice the knee 15 minutes at a time 3-4 times a day. Ibuprofen 800mg  three times a day with food OR aleve 2 tabs twice a day with food as needed for pain and inflammation. Straight leg raises, knee extensions 3 sets of 10 once a day. Knee brace for support when up and walking around. Follow up with me in 6-8 weeks or as needed if you're doing well.

## 2019-03-23 ENCOUNTER — Encounter (HOSPITAL_BASED_OUTPATIENT_CLINIC_OR_DEPARTMENT_OTHER): Payer: Self-pay | Admitting: Emergency Medicine

## 2019-03-23 ENCOUNTER — Other Ambulatory Visit: Payer: Self-pay

## 2019-03-23 ENCOUNTER — Emergency Department (HOSPITAL_BASED_OUTPATIENT_CLINIC_OR_DEPARTMENT_OTHER)
Admission: EM | Admit: 2019-03-23 | Discharge: 2019-03-23 | Disposition: A | Discharge status: Home / Self Care | Payer: Managed Care, Other (non HMO) | Attending: Emergency Medicine | Admitting: Emergency Medicine

## 2019-03-23 DIAGNOSIS — M25531 Pain in right wrist: Secondary | ICD-10-CM

## 2019-03-23 DIAGNOSIS — Z87891 Personal history of nicotine dependence: Secondary | ICD-10-CM | POA: Insufficient documentation

## 2019-03-23 DIAGNOSIS — Z79899 Other long term (current) drug therapy: Secondary | ICD-10-CM | POA: Insufficient documentation

## 2019-03-23 MED ORDER — NAPROXEN 500 MG PO TABS: 500.0000 mg | ORAL_TABLET | Freq: Two times a day (BID) | ORAL | 0 refills | Status: DC

## 2019-03-23 MED ORDER — DICLOFENAC SODIUM 1 % TD GEL: 2.0000 g | Freq: Four times a day (QID) | TRANSDERMAL | 1 refills | Status: AC

## 2019-03-23 NOTE — ED Notes (Signed)
Patient verbalizes understanding of discharge instructions. Opportunity for questioning and answers were provided. Armband removed by staff, pt discharged from ED.  

## 2019-03-23 NOTE — Discharge Instructions (Signed)
FU with orthopedics if this continues to bother you. Please take naproxen and use topical gel. Continue stretches. Wear splint at all times to alleviate pain.

## 2019-03-23 NOTE — ED Triage Notes (Signed)
R wrist pain for 1 month. Has been treated for tendonitis without relief.

## 2019-03-23 NOTE — ED Provider Notes (Signed)
[facility] EMERGENCY DEPARTMENT Provider Note   CSN: [id number] Arrival date & time: 03/23/19  1130     History   Chief Complaint Chief Complaint  Patient presents with  . Wrist Pain    HPI Gabriel Myers is a 33 y.o. male.     HPI Presents for mild, persistent, right-sided wrist pain that is worse with movement and repetitive motion better with rest and using splints, Voltaren gel, and NSAIDs. 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  Patient states that he was seen at [facility] where he was given naproxen and Voltaren gel splint which is improved symptoms significantly.  He has not followed up with orthopedics at this point.    Denies any trauma, decreased range of motion, numbness, tingling,, lesion, headache or dizziness.  Past Medical History:  Diagnosis Date  . Urticaria     Patient Active Problem List   Diagnosis Date Noted  . Right leg pain 01/02/2014  . Right knee injury 04/22/2013    History reviewed. No pertinent surgical history.      Home Medications    Prior to Admission medications   Medication Sig Start Date End Date Taking? Authorizing Provider  cetirizine (ZYRTEC ALLERGY) 10 MG tablet Take 1 tablet (10 mg total) by mouth daily. 10/05/17   Valentina Shaggy, MD  diclofenac sodium (VOLTAREN) 1 % GEL Apply 2 g topically 4 (four) times daily. 03/23/19 04/30/19  Tedd Sias, PA  diphenhydrAMINE (BENADRYL) 25 mg capsule Take 25 mg by mouth every 6 (six) hours as needed.    [provider]  ibuprofen (ADVIL,MOTRIN) 800 MG tablet Take 1 tablet (800 mg total) by mouth 3 (three) times daily. 04/19/18   Langston Masker B, PA-C  methocarbamol (ROBAXIN) 500 MG tablet Take 1-2 tablets (500-1,000 mg total) by mouth every 6 (six) hours as needed for muscle spasms (and pain). Patient not taking: Reported on 10/05/2017 10/12/16   Clayton Bibles, PA-C   naproxen (NAPROSYN) 500 MG tablet Take 1 tablet (500 mg total) by mouth 2 (two) times daily. 03/23/19   Tedd Sias, PA    Family History Family History  Problem Relation Age of Onset  . Asthma Cousin   . Diabetes Cousin   . Urticaria Mother   . Allergic rhinitis Sister   . Sudden death Neg Hx   . Hypertension Neg Hx   . Hyperlipidemia Neg Hx   . Heart attack Neg Hx     Social History Social History   Tobacco Use  . Smoking status: Former Smoker    Types: Cigarettes  . Smokeless tobacco: Never Used  Substance Use Topics  . Alcohol use: No  . Drug use: No     Allergies   Patient has no known allergies.   Review of Systems Review of Systems  Constitutional: Negative for fever.  Respiratory: Negative for shortness of breath.   Cardiovascular: Negative for chest pain.  Musculoskeletal: Negative for neck stiffness.       Right wrist pain  Skin: Negative for wound.     Physical Exam Updated Vital Signs BP 128/80 (BP Location: Right Arm)   Pulse 66   Temp 98.6 F (37 C) (Oral)   Resp 14   Ht 5\' 6"  (1.676 m)   Wt 62.1 kg   SpO2 100%   BMI 22.11 kg/m   Physical Exam Vitals signs and nursing note  reviewed.  Constitutional:      General: He is not in acute distress.    Appearance: Normal appearance. He is not ill-appearing.  HENT:     Head: Normocephalic and atraumatic.  Eyes:     General: No scleral icterus.       Right eye: No discharge.        Left eye: No discharge.     Conjunctiva/sclera: Conjunctivae normal.  Pulmonary:     Effort: Pulmonary effort is normal.     Breath sounds: No stridor.  Musculoskeletal:     Comments: Right wrist with mild diffuse tenderness over flexor tendons with deep palpation.  Negative Tinel's, negative nuchal signs, the asymptomatic tenderness.  Full range of motion active and passively.  Strength intact 5/5 and symmetric.  Neurological:     Mental Status: He is alert and oriented to person, place, and time. Mental  status is at baseline.     Comments: Sensation intact bilateral upper extremities all fingers.  Able to finger oppose, medial ulnar and radial sensation intact.      ED Treatments / Results  Labs (all labs ordered are listed, but only abnormal results are displayed) Labs Reviewed - No data to display  EKG None  Radiology No results found.  Procedures Procedures (including critical care time)  Medications Ordered in ED Medications - No data to display   Initial Impression / Assessment and Plan / ED Course  I have reviewed the triage vital signs and the nursing notes.  Pertinent labs & imaging results that were available during my care of the patient were reviewed by me and considered in my medical decision making (see chart for details).        Patient well-appearing and of right wrist pain nontraumatic signs of infection, doubt fracture.  Likely this is tendinitis as it is improved with stretching, NSAIDs splint.  Suggested continued use of use and a patient given a splint.  Will follow up with orthopedics.    The patient appears reasonably screened and/or stabilized for discharge and I doubt any other medical condition or other [facility] requiring further screening, evaluation, or treatment in the ED at this time prior to discharge.  Patient is hemodynamically stable, in NAD, and able to ambulate in the ED. Pain has been managed or a plan has been made for home management and has no complaints prior to discharge. Patient is comfortable with above plan and is stable for discharge at this time. All questions were answered prior to disposition. Results from the ER workup discussed with the patient face to face and all questions answered to the best of my ability. The patient is safe for discharge with strict return precautions. Patient appears safe for discharge with appropriate follow-up.  Conveyed my impression with the patient and he voiced understanding and is agreeable to plan.    An After Visit Summary was printed and given to the patient.  Portions of this note were generated with Scientist, clinical (histocompatibility and immunogenetics). Dictation errors may occur despite best attempts at proofreading.    Final Clinical Impressions(s) / ED Diagnoses   Final diagnoses:  Right wrist pain    ED Discharge Orders         Ordered    naproxen (NAPROSYN) 500 MG tablet  2 times daily     03/23/19 1413    diclofenac sodium (VOLTAREN) 1 % GEL  4 times daily     03/23/19 1413  Solon AugustaFondaw, Brehanna Deveny [place], [place] 03/23/19 1423    Raeford RazorKohut, Stephen, MD 03/24/19 1114

## 2020-01-20 ENCOUNTER — Other Ambulatory Visit: Payer: Self-pay

## 2020-01-20 ENCOUNTER — Emergency Department (HOSPITAL_BASED_OUTPATIENT_CLINIC_OR_DEPARTMENT_OTHER)
Admission: EM | Admit: 2020-01-20 | Discharge: 2020-01-20 | Disposition: A | Discharge status: Home / Self Care | Payer: Self-pay | Attending: Emergency Medicine | Admitting: Emergency Medicine

## 2020-01-20 ENCOUNTER — Emergency Department (HOSPITAL_BASED_OUTPATIENT_CLINIC_OR_DEPARTMENT_OTHER): Payer: Self-pay

## 2020-01-20 ENCOUNTER — Encounter (HOSPITAL_BASED_OUTPATIENT_CLINIC_OR_DEPARTMENT_OTHER): Payer: Self-pay

## 2020-01-20 DIAGNOSIS — M25561 Pain in right knee: Secondary | ICD-10-CM | POA: Insufficient documentation

## 2020-01-20 DIAGNOSIS — Z79899 Other long term (current) drug therapy: Secondary | ICD-10-CM | POA: Insufficient documentation

## 2020-01-20 DIAGNOSIS — Z87891 Personal history of nicotine dependence: Secondary | ICD-10-CM | POA: Insufficient documentation

## 2020-01-20 NOTE — Discharge Instructions (Signed)
  You have been seen here for right knee pain.  Exam and imaging all look reassuring.  I recommend taking over-the-counter pain medications like ibuprofen and or Tylenol every 6 hours as needed please follow dosing the back of bottle.  Also recommend keeping your leg elevated and applying ice to the area as this can help decrease swelling and inflammation.   I recommend you follow-up with your sports medicine doctor for further evaluation management of your knee.  I want to come back to the emergency department if you develop severe chest pain, shortness of breath, severe abdominal pain, uncontrolled nausea, vomiting, diarrhea as these symptoms require further evaluation management.

## 2020-01-20 NOTE — ED Notes (Signed)
Pt standing at doorway awaiting provider evaluation.

## 2020-01-20 NOTE — ED Provider Notes (Signed)
[facility] EMERGENCY DEPARTMENT Provider Note   CSN: [id number] Arrival date & time: 01/20/20  1224     History Chief Complaint  Patient presents with  . Leg Pain    Gabriel Myers is a 33 y.o. male.  HPI  Patient presents with no significant medical history to the emergency department with chief complaint of right knee pain.  Patient states while he was out playing with his son fell directly on his right knee and it has been hurting ever since.  Patient states it hurts when he tries to bend it and he noted some swelling when it first happened but now the swelling has gone away.  He denies hitting his head, losing consciousness, being on anticoags.  He has been taking ibuprofen which appears to help.  He denies headache, fever, chills, shortness of breath, chest pain, vomiting, pedal edema.  Past Medical History:  Diagnosis Date  . Urticaria     Patient Active Problem List   Diagnosis Date Noted  . Right leg pain 01/02/2014  . Right knee injury 04/22/2013    History reviewed. No pertinent surgical history.     Family History  Problem Relation Age of Onset  . Asthma Cousin   . Diabetes Cousin   . Urticaria Mother   . Allergic rhinitis Sister   . Sudden death Neg Hx   . Hypertension Neg Hx   . Hyperlipidemia Neg Hx   . Heart attack Neg Hx     Social History   Tobacco Use  . Smoking status: Former Smoker    Types: Cigarettes  . Smokeless tobacco: Never Used  Vaping Use  . Vaping Use: Never used  Substance Use Topics  . Alcohol use: No  . Drug use: No    Home Medications Prior to Admission medications   Medication Sig Start Date End Date Taking? Authorizing Provider  cetirizine (ZYRTEC ALLERGY) 10 MG tablet Take 1 tablet (10 mg total) by mouth daily. 10/05/17   Alfonse Spruce, MD  diphenhydrAMINE (BENADRYL) 25 mg capsule Take 25 mg by mouth every 6 (six) hours as needed.    [provider]  ibuprofen (ADVIL,MOTRIN) 800 MG tablet  Take 1 tablet (800 mg total) by mouth 3 (three) times daily. 04/19/18   Aviva Kluver B, PA-C  methocarbamol (ROBAXIN) 500 MG tablet Take 1-2 tablets (500-1,000 mg total) by mouth every 6 (six) hours as needed for muscle spasms (and pain). Patient not taking: Reported on 10/05/2017 10/12/16   Trixie Dredge, PA-C  naproxen (NAPROSYN) 500 MG tablet Take 1 tablet (500 mg total) by mouth 2 (two) times daily. 03/23/19   Gailen Shelter, PA    Allergies    Patient has no known allergies.  Review of Systems   Review of Systems  Constitutional: Negative for chills and fever.  HENT: Negative for congestion, tinnitus, trouble swallowing and voice change.   Respiratory: Negative for cough and shortness of breath.   Cardiovascular: Negative for chest pain.  Gastrointestinal: Negative for abdominal pain, diarrhea, nausea and vomiting.  Genitourinary: Negative for enuresis.  Musculoskeletal: Positive for joint swelling. Negative for back pain.  Skin: Negative for rash.  Neurological: Negative for dizziness and headaches.  Hematological: Does not bruise/bleed easily.    Physical Exam Updated Vital Signs BP 128/88 (BP Location: Left Arm)   Pulse 66   Temp 98.7 F (37.1 C) (Oral)   Resp 18   Ht 5\' 6"  (1.676 m)   Wt 65.8 kg   SpO2  100%   BMI 23.40 kg/m   Physical Exam Vitals and nursing note reviewed.  Constitutional:      General: He is not in acute distress.    Appearance: Normal appearance. He is not ill-appearing or diaphoretic.  HENT:     Head: Normocephalic and atraumatic.     Nose: No congestion or rhinorrhea.  Eyes:     General: No scleral icterus.       Right eye: No discharge.        Left eye: No discharge.     Conjunctiva/sclera: Conjunctivae normal.  Pulmonary:     Effort: Pulmonary effort is normal. No respiratory distress.     Breath sounds: Normal breath sounds. No wheezing.  Musculoskeletal:        General: Tenderness present. No swelling or deformity.     Cervical  back: Neck supple.     Right lower leg: No edema.     Left lower leg: No edema.     Comments: Right knee was visualized, no gross abnormalities noted, slight tender to palpation on the lateral posterior side of the knee.  Patient had full range of motion, 5 out of 5 strength, good pedal pulses.  No laxity noted in the knee when applying lateral or medial pressure.  Skin:    General: Skin is warm and dry.     Coloration: Skin is not jaundiced or pale.  Neurological:     Mental Status: He is alert and oriented to person, place, and time.  Psychiatric:        Mood and Affect: Mood normal.     ED Results / Procedures / Treatments   Labs (all labs ordered are listed, but only abnormal results are displayed) Labs Reviewed - No data to display  EKG None  Radiology DG Knee Complete 4 Views Right  Result Date: 01/20/2020 CLINICAL DATA:  Knee pain after fall EXAM: RIGHT KNEE - COMPLETE 4+ VIEW COMPARISON:  04/15/2013 FINDINGS: No fracture or malalignment. Joint spaces are maintained. No large knee effusion. IMPRESSION: Negative. Electronically Signed   By: Jasmine Pang M.D.   On: 01/20/2020 16:26    Procedures Procedures (including critical care time)  Medications Ordered in ED Medications - No data to display  ED Course  I have reviewed the triage vital signs and the nursing notes.  Pertinent labs & imaging results that were available during my care of the patient were reviewed by me and considered in my medical decision making (see chart for details).    MDM Rules/Calculators/A&P                          I have personally reviewed all imaging, labs and have interpreted them.  Patient presents with right knee pain after suffering mechanical fall.  Patient was alert oriented not appearing acute distress, vital signs reassuring.  Right knee was visualized, no gross abnormalities noted, slight pain to palpation along the lateral posterior side of the knee.  Patient full range of  motion, 5 out of 5 strength good pedal pulses neurovascular fully intact.  No laxity noted when applying lateral or medial pressure.  Will order x-ray of knee for further evaluation.  Knee x-ray does not show any acute abnormalities.  I have low suspicion for cellulitis or septic joint as there is no erythema, edema, on the exam.  The joint was not warm to the touch.  No history of IV drug use.   Low Suspicion  for fracture or dislocation as x-ray does not show any acute abnormalities.  Low suspicion for compartment syndrome as neurovascular is fully intact in the lower extremity.  I suspect patient may have strained his LCL or has a slight tear in his meniscus.  Will recommend further follow-up with his orthopedic doctor.  Patient appears resting comfortably show no acute  disstress.  Vital signs remained stable does not meet criteria to be admitted to the hospital.  I suspect patient has a LCL strain or possible meniscus tear.  Will recommend further follow-up and orthopedic for evaluation.  Patient is given at home care and strict return precautions.  Patient verbalized that he understood and agreed to plan. Final Clinical Impression(s) / ED Diagnoses Final diagnoses:  Acute pain of right knee    Rx / DC Orders ED Discharge Orders    None       Carroll Sage, PA-C 01/20/20 1649    Vanetta Mulders, MD 01/29/20 1050

## 2020-01-20 NOTE — ED Triage Notes (Signed)
Pt arrives with c/o right leg pain after tripping yesterday reports some swelling in his right knee, pain in whole leg.

## 2020-01-20 NOTE — ED Notes (Signed)
Pt discharged to home. Discharge instructions have been discussed with patient and/or family members. Pt verbally acknowledges understanding d/c instructions, and endorses comprehension to checkout at registration before leaving.  °

## 2020-04-09 ENCOUNTER — Emergency Department (HOSPITAL_BASED_OUTPATIENT_CLINIC_OR_DEPARTMENT_OTHER): Payer: Self-pay

## 2020-04-09 ENCOUNTER — Emergency Department (HOSPITAL_BASED_OUTPATIENT_CLINIC_OR_DEPARTMENT_OTHER)
Admission: EM | Admit: 2020-04-09 | Discharge: 2020-04-09 | Disposition: A | Discharge status: Home / Self Care | Payer: Self-pay | Attending: Emergency Medicine | Admitting: Emergency Medicine

## 2020-04-09 ENCOUNTER — Encounter (HOSPITAL_BASED_OUTPATIENT_CLINIC_OR_DEPARTMENT_OTHER): Payer: Self-pay

## 2020-04-09 ENCOUNTER — Other Ambulatory Visit: Payer: Self-pay

## 2020-04-09 DIAGNOSIS — Z87891 Personal history of nicotine dependence: Secondary | ICD-10-CM | POA: Insufficient documentation

## 2020-04-09 DIAGNOSIS — R369 Urethral discharge, unspecified: Secondary | ICD-10-CM | POA: Insufficient documentation

## 2020-04-09 DIAGNOSIS — Z20822 Contact with and (suspected) exposure to covid-19: Secondary | ICD-10-CM | POA: Insufficient documentation

## 2020-04-09 DIAGNOSIS — Z114 Encounter for screening for human immunodeficiency virus [HIV]: Secondary | ICD-10-CM | POA: Insufficient documentation

## 2020-04-09 DIAGNOSIS — M25561 Pain in right knee: Secondary | ICD-10-CM | POA: Insufficient documentation

## 2020-04-09 LAB — CBC WITH DIFFERENTIAL/PLATELET
Abs Immature Granulocytes: 0.03 10*3/uL (ref 0.00–0.07)
Basophils Absolute: 0 10*3/uL (ref 0.0–0.1)
Basophils Relative: 1 %
Eosinophils Absolute: 0 10*3/uL (ref 0.0–0.5)
Eosinophils Relative: 0 %
HCT: 42.7 % (ref 39.0–52.0)
Hemoglobin: 13.9 g/dL (ref 13.0–17.0)
Immature Granulocytes: 1 %
Lymphocytes Relative: 10 %
Lymphs Abs: 0.7 10*3/uL (ref 0.7–4.0)
MCH: 27.6 pg (ref 26.0–34.0)
MCHC: 32.6 g/dL (ref 30.0–36.0)
MCV: 84.7 fL (ref 80.0–100.0)
Monocytes Absolute: 0.4 10*3/uL (ref 0.1–1.0)
Monocytes Relative: 6 %
Neutro Abs: 5.4 10*3/uL (ref 1.7–7.7)
Neutrophils Relative %: 82 %
Platelets: 166 10*3/uL (ref 150–400)
RBC: 5.04 MIL/uL (ref 4.22–5.81)
RDW: 14.5 % (ref 11.5–15.5)
WBC: 6.5 10*3/uL (ref 4.0–10.5)
nRBC: 0 % (ref 0.0–0.2)

## 2020-04-09 LAB — BASIC METABOLIC PANEL
Anion gap: 8 (ref 5–15)
BUN: 12 mg/dL (ref 6–20)
CO2: 24 mmol/L (ref 22–32)
Calcium: 9 mg/dL (ref 8.9–10.3)
Chloride: 101 mmol/L (ref 98–111)
Creatinine, Ser: 1.13 mg/dL (ref 0.61–1.24)
GFR, Estimated: >60 mL/min (ref >60)
Glucose, Bld: 117 mg/dL — ABNORMAL HIGH (ref 70–99)
Potassium: 3.5 mmol/L (ref 3.5–5.1)
Sodium: 133 mmol/L — ABNORMAL LOW (ref 135–145)

## 2020-04-09 LAB — URINALYSIS, ROUTINE W REFLEX MICROSCOPIC
Bilirubin Urine: NEGATIVE
Glucose, UA: NEGATIVE mg/dL
Ketones, ur: NEGATIVE mg/dL
Nitrite: NEGATIVE
Protein, ur: NEGATIVE mg/dL
Specific Gravity, Urine: 1.02 (ref 1.005–1.030)
pH: 6 (ref 5.0–8.0)

## 2020-04-09 LAB — HIV ANTIBODY (ROUTINE TESTING W REFLEX): HIV Screen 4th Generation wRfx: NONREACTIVE

## 2020-04-09 LAB — URINALYSIS, MICROSCOPIC (REFLEX): WBC, UA: >50 WBC/hpf (ref 0–5)

## 2020-04-09 LAB — RESP PANEL BY RT-PCR (FLU A&B, COVID) ARPGX2
Influenza A by PCR: NEGATIVE
Influenza B by PCR: NEGATIVE
SARS Coronavirus 2 by RT PCR: NEGATIVE

## 2020-04-09 MED ORDER — ACETAMINOPHEN 325 MG PO TABS
650.0000 mg | ORAL_TABLET | Freq: Once | ORAL | Status: AC
  Administered 2020-04-09: 650 mg via ORAL
  Filled 2020-04-09: qty 2

## 2020-04-09 MED ORDER — DOXYCYCLINE HYCLATE 100 MG PO TABS
100.0000 mg | ORAL_TABLET | Freq: Once | ORAL | Status: AC
  Administered 2020-04-09: 100 mg via ORAL
  Filled 2020-04-09: qty 1

## 2020-04-09 MED ORDER — LIDOCAINE HCL (PF) 1 % IJ SOLN
1.0000 mL | Freq: Once | INTRAMUSCULAR | Status: AC
  Administered 2020-04-09: 1 mL
  Filled 2020-04-09: qty 5

## 2020-04-09 MED ORDER — DOXYCYCLINE HYCLATE 100 MG PO CAPS: 100.0000 mg | ORAL_CAPSULE | Freq: Two times a day (BID) | ORAL | 0 refills | Status: AC

## 2020-04-09 MED ORDER — NAPROXEN 250 MG PO TABS
500.0000 mg | ORAL_TABLET | Freq: Once | ORAL | Status: AC
  Administered 2020-04-09: 500 mg via ORAL
  Filled 2020-04-09: qty 2

## 2020-04-09 MED ORDER — CEFTRIAXONE SODIUM 500 MG IJ SOLR
500.0000 mg | Freq: Once | INTRAMUSCULAR | Status: AC
  Administered 2020-04-09: 500 mg via INTRAMUSCULAR
  Filled 2020-04-09: qty 500

## 2020-04-09 NOTE — ED Provider Notes (Addendum)
[facility] EMERGENCY DEPARTMENT Provider Note   CSN: [id number] Arrival date & time: 04/09/20  1003     History Chief Complaint  Patient presents with  . Leg Pain    Kingsley PlanBrandon Marlowe is a 34 y.o. male with noncontributory past medical history. Had covid vaccinations.   HPI Patient presents to emergency room today with chief complaint of right leg pain x 2 days.  He states he initially injured his right leg x 2 months ago when he tripped over his godson and fell landing on his right knee.  He was seen in the emergency department at that time and had a negative x-ray.  He states his pain lasted only for a week then. He states the knee pain started this time without any injury.  He is describing an aching pain located in his kneecap and feels like it radiates up his thigh.  He works for a [employer] and does a lot of standing on his feet.  He has not had any over the counter medications for symptoms prior to arrival. He states the pain is intermittent. He rates pain 6/10 in severity. He has not noticed any swelling in the knee.  He is also endorsing dysuria and penile discharge x1 week.  He states he sexually active with 1 male partner without protection.  He denies fever, chills, cough, congestion, abdominal pain, nausea, emesis, back pain, numbness, weakness, tingling.      Past Medical History:  Diagnosis Date  . Urticaria     Patient Active Problem List   Diagnosis Date Noted  . Right leg pain 01/02/2014  . Right knee injury 04/22/2013    History reviewed. No pertinent surgical history.     Family History  Problem Relation Age of Onset  . Asthma Cousin   . Diabetes Cousin   . Urticaria Mother   . Allergic rhinitis Sister   . Sudden death Neg Hx   . Hypertension Neg Hx   . Hyperlipidemia Neg Hx   . Heart attack Neg Hx     Social History   Tobacco Use  . Smoking status: Former Smoker    Types: Cigarettes  . Smokeless tobacco: Never Used  Vaping Use  .  Vaping Use: Never used  Substance Use Topics  . Alcohol use: No  . Drug use: No    Home Medications Prior to Admission medications   Medication Sig Start Date End Date Taking? Authorizing Provider  doxycycline (VIBRAMYCIN) 100 MG capsule Take 1 capsule (100 mg total) by mouth 2 (two) times daily for 7 days. 04/10/20 04/17/20  Shanon AceWalisiewicz, Shawndra Clute E, PA-C    Allergies    Patient has no known allergies.  Review of Systems   Review of Systems All other systems are reviewed and are negative for acute change except as noted in the HPI.  Physical Exam Updated Vital Signs BP 120/85 (BP Location: Left Arm)   Pulse 96   Temp (!) 100.9 F (38.3 C) (Oral)   Resp 16   Ht 5\' 6"  (1.676 m)   Wt 63.5 kg   SpO2 100%   BMI 22.60 kg/m   Physical Exam Vitals and nursing note reviewed.  Constitutional:      Appearance: He is well-developed. He is not ill-appearing or toxic-appearing.  HENT:     Head: Normocephalic and atraumatic.     Nose: Nose normal.  Eyes:     General: No scleral icterus.       Right eye: No discharge.  Left eye: No discharge.     Conjunctiva/sclera: Conjunctivae normal.  Neck:     Vascular: No JVD.  Cardiovascular:     Rate and Rhythm: Normal rate and regular rhythm.     Pulses: Normal pulses.          Dorsalis pedis pulses are 2+ on the right side and 2+ on the left side.     Heart sounds: Normal heart sounds.  Pulmonary:     Effort: Pulmonary effort is normal.     Breath sounds: Normal breath sounds.  Abdominal:     General: There is no distension.  Genitourinary:    Comments: Chaperone Greg NT  present for exam. No discharge or urethritis noted. No signs of sores or lesions or erythema on the penis or testicles. The penis and testicles are nontender. No testicular masses or swelling. No scrotal swelling. No signs of any inguinal hernias. Cremaster reflex present bilaterally.   Musculoskeletal:        General: Normal range of motion.     Cervical  back: Normal range of motion.     Right knee: Bony tenderness present. No swelling, deformity, effusion, erythema, ecchymosis or crepitus. Normal range of motion.     Right lower leg: Normal. No edema.     Left lower leg: No edema.     Right ankle: Normal.     Comments: Ambulatory with normal gait.  Skin:    General: Skin is warm and dry.     Capillary Refill: Capillary refill takes less than 2 seconds.     Comments: Equal tactile temperature to all extremities.  Neurological:     Mental Status: He is oriented to person, place, and time.     GCS: GCS eye subscore is 4. GCS verbal subscore is 5. GCS motor subscore is 6.     Comments: Fluent speech, no facial droop.  Psychiatric:        Behavior: Behavior normal.     ED Results / Procedures / Treatments   Labs (all labs ordered are listed, but only abnormal results are displayed) Labs Reviewed  URINALYSIS, ROUTINE W REFLEX MICROSCOPIC - Abnormal; Notable for the following components:      Result Value   Hgb urine dipstick TRACE (*)    Leukocytes,Ua SMALL (*)    All other components within normal limits  URINALYSIS, MICROSCOPIC (REFLEX) - Abnormal; Notable for the following components:   Bacteria, UA FEW (*)    All other components within normal limits  BASIC METABOLIC PANEL - Abnormal; Notable for the following components:   Sodium 133 (*)    Glucose, Bld 117 (*)    All other components within normal limits  URINE CULTURE  RESP PANEL BY RT-PCR (FLU A&B, COVID) ARPGX2  HIV ANTIBODY (ROUTINE TESTING W REFLEX)  CBC WITH DIFFERENTIAL/PLATELET  RPR  GC/CHLAMYDIA PROBE AMP ([place]) NOT AT [facility]    EKG None  Radiology DG Knee Complete 4 Views Right  Result Date: 04/09/2020 CLINICAL DATA:  Right leg pain, knee pain EXAM: RIGHT KNEE - COMPLETE 4+ VIEW COMPARISON:  01/20/2020 FINDINGS: No evidence of fracture, dislocation, or joint effusion. No evidence of arthropathy or other focal bone abnormality. Soft tissues are  unremarkable. IMPRESSION: Negative. Electronically Signed   By: Charlett Nose M.D.   On: 04/09/2020 11:43    Procedures Procedures (including critical care time)  Medications Ordered in ED Medications  acetaminophen (TYLENOL) tablet 650 mg (650 mg Oral Given 04/09/20 1237)  cefTRIAXone (ROCEPHIN) injection  500 mg (500 mg Intramuscular Given 04/09/20 1238)  lidocaine (PF) (XYLOCAINE) 1 % injection 1 mL (1 mL Other Given 04/09/20 1238)  doxycycline (VIBRA-TABS) tablet 100 mg (100 mg Oral Given 04/09/20 1237)  naproxen (NAPROSYN) tablet 500 mg (500 mg Oral Given 04/09/20 1237)    ED Course  I have reviewed the triage vital signs and the nursing notes.  Pertinent labs & imaging results that were available during my care of the patient were reviewed by me and considered in my medical decision making (see chart for details).  Vitals:   04/09/20 1230 04/09/20 1341 04/09/20 1420 04/09/20 1456  BP:   114/75   Pulse:   96   Resp:   18   Temp: (!) 102.3 F (39.1 C) (!) 100.9 F (38.3 C)  99.6 F (37.6 C)  TempSrc: Oral Oral    SpO2:   100%   Weight:      Height:          MDM Rules/Calculators/A&P                          History provided by patient with additional history obtained from chart review.    Patient presents to the ED with complaints of pain to the right knee pain without recent injury and penile discharge and dysuria. He was found to be febrile in triage, HDS, in no acute distress. Non toxic appearing. His lungs are clear to auscultation all fields and he has normal work of breathing.  No URI symptoms.  Exam of right knee without any obvious deformity or open wounds. Full ROM of right knee. Tender to palpation over patella.  Neurovascular intact distally. After finishing ortho exam patient tells me he was too embarrassed to tell the the RN he is really here for STD check. GU exam performed with chaperone present.  No tenderness to palpation of the testes or epididymis to  suggest orchitis or epididymitis.  STD cultures obtained including HIV, syphilis, gonorrhea and chlamydia.  Prophylactically treated with doxycycline and Rocephin.  He has no abdominal tenderness, no abdominal pain or painful bowel movements to indicate prostatitis.  Exam is not consistent with septic arthritis or gonococcal infection of the knee.  X-ray of right knee viewed by me without any fracture, dislocation or effusion.  He was given Tylenol and naproxen for pain and fever.  When temperature rechecked he has only slightly improved.  BC and BMP obtained given fever and are overall unremarkable.  Covid test is pending at time of discharge.  He is aware he needs to quarantine until he has test result.  Discussed treatment if he is Covid positive.  Patient sent to his pharmacy for doxycycline.  He knows he will need to notify partners if STD testing is positive.  Temperature checked prior to discharge and he is afebrile.  The patient appears reasonably screened and/or stabilized for discharge and I doubt any other medical condition or other [facility] requiring further screening, evaluation, or treatment in the ED at this time prior to discharge. The patient is safe for discharge with strict return precautions discussed. Recommend pcp follow up if needed. The patient was discussed with and seen by Dr. Particia Nearing who agrees with the treatment plan.  Eliazer Hemphill was evaluated in Emergency Department on 04/09/2020 for the symptoms described in the history of present illness. He was evaluated in the context of the global COVID-19 pandemic, which necessitated consideration that the patient might be  at risk for infection with the SARS-CoV-2 virus that causes COVID-19. Institutional protocols and algorithms that pertain to the evaluation of patients at risk for COVID-19 are in a state of rapid change based on information released by regulatory bodies including the CDC and federal and state organizations. These policies  and algorithms were followed during the patient's care in the ED.   Portions of this note were generated with Scientist, clinical (histocompatibility and immunogenetics). Dictation errors may occur despite best attempts at proofreading.   Final Clinical Impression(s) / ED Diagnoses Final diagnoses:  Acute pain of right knee  Penile discharge    Rx / DC Orders ED Discharge Orders         Ordered    doxycycline (VIBRAMYCIN) 100 MG capsule  2 times daily        04/09/20 1535           Shanon Ace, PA-C 04/09/20 1543    Shanon Ace, PA-C 04/09/20 1554    Jacalyn Lefevre, MD 04/10/20 [phone]

## 2020-04-09 NOTE — ED Triage Notes (Signed)
Pts R leg pain since wednesday. Pt reports injury to R leg on labor day. Pt ambulatory in NAD to triage.

## 2020-04-09 NOTE — Discharge Instructions (Addendum)
-  Prescription for doxycycline was sent to the pharmacy.  This is to treat chlamydia.  If your chlamydia test is positive you will need to take all of the pills.  If it is negative you can stop taking them.  You were treated with Rocephin for possible gonorrhea today.  If your STD tests are positive you need to let your partner know so they can be treated as well.  You can take ibuprofen and tylenol for pain in your knee if needed.  Covid test is pending.  If the result is positive you will receive a phone call.   If covid is positive:  Medications- You can take medications to help treat your symptoms: -Tylenol for fever and body aches. Please take as prescribed on the bottle. -Over the coutner cough medicine such as mucinex, robitussin, or other brands. -Flonase or saline nasal spray for nasal congestion -Vitamins as recommended by CDC  Treatment- This is a virus and unfortunately there are no antibitotics approved to treat this virus at this time. It is important to monitor your symptoms closely: -You should have a theremometer at home to check your temperature when feeling feverish. -Use a pulse ox meter to measure your oxygen when feeling short of breath.  -If your fever is over 100.4 despite taking tylenol or if your oxygen level drops below 94% these are reasons to return to the emergency department for further evaluation.   -You need to quarantine for 10 days starting today.  You can return to work, school or normal activities if on day 10 you are fever free without the use of Tylenol or ibuprofen.  You will need to continue quarantine if you still have a fever over 100.4.  Again: symptoms of shortness of breath, chest pain, difficulty breathing, new onset of confusion, any symptoms that are concerning. If any of these symptoms you should come to emergency department for evaluation.   I hope you feel better soon

## 2020-04-10 LAB — URINE CULTURE

## 2020-04-10 LAB — RPR: RPR Ser Ql: NONREACTIVE

## 2020-04-12 LAB — GC/CHLAMYDIA PROBE AMP (~~LOC~~) NOT AT ARMC
Chlamydia: NEGATIVE
Comment: NEGATIVE
Comment: NORMAL
Neisseria Gonorrhea: POSITIVE — AB

## 2020-05-26 ENCOUNTER — Emergency Department (HOSPITAL_BASED_OUTPATIENT_CLINIC_OR_DEPARTMENT_OTHER)
Admission: EM | Admit: 2020-05-26 | Discharge: 2020-05-26 | Disposition: A | Discharge status: Home / Self Care | Payer: Self-pay | Attending: Emergency Medicine | Admitting: Emergency Medicine

## 2020-05-26 ENCOUNTER — Other Ambulatory Visit: Payer: Self-pay

## 2020-05-26 ENCOUNTER — Encounter (HOSPITAL_BASED_OUTPATIENT_CLINIC_OR_DEPARTMENT_OTHER): Payer: Self-pay | Admitting: *Deleted

## 2020-05-26 DIAGNOSIS — S80861A Insect bite (nonvenomous), right lower leg, initial encounter: Secondary | ICD-10-CM | POA: Insufficient documentation

## 2020-05-26 DIAGNOSIS — W57XXXA Bitten or stung by nonvenomous insect and other nonvenomous arthropods, initial encounter: Secondary | ICD-10-CM | POA: Insufficient documentation

## 2020-05-26 DIAGNOSIS — Z5321 Procedure and treatment not carried out due to patient leaving prior to being seen by health care provider: Secondary | ICD-10-CM | POA: Insufficient documentation

## 2020-05-26 NOTE — ED Triage Notes (Signed)
C/o insect bite to right leg x 4 days

## 2020-05-26 NOTE — ED Notes (Signed)
Pt advised he did not want to wait any longer and will come back later. Pt advised the wait times are unpredictable and pt was encouraged to stay, but pt stated he was going home.

## 2020-05-28 ENCOUNTER — Other Ambulatory Visit: Payer: Self-pay

## 2020-05-28 ENCOUNTER — Emergency Department (HOSPITAL_BASED_OUTPATIENT_CLINIC_OR_DEPARTMENT_OTHER)
Admission: EM | Admit: 2020-05-28 | Discharge: 2020-05-28 | Disposition: A | Discharge status: Home / Self Care | Payer: Self-pay | Attending: Emergency Medicine | Admitting: Emergency Medicine

## 2020-05-28 ENCOUNTER — Encounter (HOSPITAL_BASED_OUTPATIENT_CLINIC_OR_DEPARTMENT_OTHER): Payer: Self-pay

## 2020-05-28 DIAGNOSIS — Z87891 Personal history of nicotine dependence: Secondary | ICD-10-CM | POA: Insufficient documentation

## 2020-05-28 DIAGNOSIS — W57XXXA Bitten or stung by nonvenomous insect and other nonvenomous arthropods, initial encounter: Secondary | ICD-10-CM

## 2020-05-28 DIAGNOSIS — L03115 Cellulitis of right lower limb: Secondary | ICD-10-CM | POA: Insufficient documentation

## 2020-05-28 DIAGNOSIS — L0390 Cellulitis, unspecified: Secondary | ICD-10-CM

## 2020-05-28 DIAGNOSIS — S70361A Insect bite (nonvenomous), right thigh, initial encounter: Secondary | ICD-10-CM | POA: Insufficient documentation

## 2020-05-28 MED ORDER — CEPHALEXIN 500 MG PO CAPS: 500.0000 mg | ORAL_CAPSULE | Freq: Four times a day (QID) | ORAL | 0 refills | Status: AC

## 2020-05-28 NOTE — ED Triage Notes (Signed)
Pt reports he was bit by a bug that first noticed last Sunday. Pt states it was on his R thigh.

## 2020-05-28 NOTE — ED Provider Notes (Signed)
[facility] EMERGENCY DEPARTMENT Provider Note   CSN: [id number] Arrival date & time: 05/28/20  1015     History Chief Complaint  Patient presents with  . Insect Bite    Gale Hulse is a 35 y.o. male.  HPI 35 year old male with no significant medical history presents to the ER with complaints of a "bug bite" to his right thigh.  Did not see what bit him.  He is concerned this may be a spider.  Has had some pain and mild redness to the area.  No fevers or chills.  No abdominal pain, nausea or vomiting.  He has been using rubbing alcohol to clean the wound.  Denies any numbness or tingling in his extremities.    Past Medical History:  Diagnosis Date  . Urticaria     Patient Active Problem List   Diagnosis Date Noted  . Right leg pain 01/02/2014  . Right knee injury 04/22/2013    History reviewed. No pertinent surgical history.     Family History  Problem Relation Age of Onset  . Asthma Cousin   . Diabetes Cousin   . Urticaria Mother   . Allergic rhinitis Sister   . Sudden death Neg Hx   . Hypertension Neg Hx   . Hyperlipidemia Neg Hx   . Heart attack Neg Hx     Social History   Tobacco Use  . Smoking status: Former Smoker    Types: Cigarettes  . Smokeless tobacco: Never Used  Vaping Use  . Vaping Use: Never used  Substance Use Topics  . Alcohol use: No  . Drug use: No    Home Medications Prior to Admission medications   Medication Sig Start Date End Date Taking? Authorizing Provider  cephALEXin (KEFLEX) 500 MG capsule Take 1 capsule (500 mg total) by mouth 4 (four) times daily for 7 days. 05/28/20 06/04/20 Yes Mare Ferrari, PA-C    Allergies    Patient has no known allergies.  Review of Systems   Review of Systems  Constitutional: Negative for fever.  Skin: Positive for color change and wound.    Physical Exam Updated Vital Signs BP 136/79 (BP Location: Left Arm)   Pulse 78   Temp 98.1 F (36.7 C) (Oral)   Resp 18   Ht 5\' 6"   (1.676 m)   Wt 61.2 kg   SpO2 100%   BMI 21.79 kg/m   Physical Exam Vitals reviewed.  Constitutional:      Appearance: Normal appearance.  HENT:     Head: Normocephalic and atraumatic.  Eyes:     General:        Right eye: No discharge.        Left eye: No discharge.     Extraocular Movements: Extraocular movements intact.     Conjunctiva/sclera: Conjunctivae normal.  Musculoskeletal:        General: No swelling. Normal range of motion.  Skin:    Findings: Erythema present.     Comments: 2 to 3 cm in diameter indurated area to his right upper thigh with no central fluctuance.  No discharge.  Very mild warmth and erythema.  No evidence of necrosis.  No target lesions. No evidence of bullae, sloughing, blisters, pustules,  draining sinus tracts, no superficial abscesses, no vesicles, desquamation, no target lesions with dusky purpura or central bulla.  Not tender to touch.    Neurological:     General: No focal deficit present.     Mental Status: He is  alert and oriented to person, place, and time.  Psychiatric:        Mood and Affect: Mood normal.        Behavior: Behavior normal.       ED Results / Procedures / Treatments   Labs (all labs ordered are listed, but only abnormal results are displayed) Labs Reviewed - No data to display  EKG None  Radiology No results found.  Procedures Procedures (including critical care time)  Medications Ordered in ED Medications - No data to display  ED Course  I have reviewed the triage vital signs and the nursing notes.  Pertinent labs & imaging results that were available during my care of the patient were reviewed by me and considered in my medical decision making (see chart for details).    MDM Rules/Calculators/A&P                         35 year old male who presents to the ER with complaints of a bug bite.  Right thigh with evidence of indurated likely small abscess.  No indication for drainage.  Mildly warm to the  touch.  Is been ongoing since "Sunday.  No evidence of target lesions, sloughing or bullae.  Low suspicion for Lyme disease, SJS, [place].  Patient denies any history of IV drug use.  Afebrile here.  Will place on Keflex, encouraged warm compresses.  Return precautions discussed.  He voiced understanding and is agreeable.  At this stage in the ED course, the patient's medical screening stable for discharge.  Final Clinical Impression(s) / ED Diagnoses Final diagnoses:  Insect bite, unspecified site, initial encounter  Cellulitis, unspecified cellulitis site    Rx / DC Orders ED Discharge Orders         Ordered    cephALEXin (KEFLEX) 500 MG capsule  4 times daily        01" /14/22 1244           05-17-1971 05/28/20 1245    05/30/20, MD 06/02/20 [phone]

## 2020-05-28 NOTE — Discharge Instructions (Addendum)
Please take the antibiotic as directed until finished.  You may apply warm compresses, soak in a hot bath.  You may take Tylenol or ibuprofen for pain.  Please keep the area clean and dry.  Return to the ER for any new or worsening symptoms.

## 2021-09-24 ENCOUNTER — Emergency Department (HOSPITAL_BASED_OUTPATIENT_CLINIC_OR_DEPARTMENT_OTHER)
Admission: EM | Admit: 2021-09-24 | Discharge: 2021-09-24 | Disposition: A | Discharge status: Home / Self Care | Payer: No Typology Code available for payment source | Attending: Emergency Medicine | Admitting: Emergency Medicine

## 2021-09-24 ENCOUNTER — Encounter (HOSPITAL_BASED_OUTPATIENT_CLINIC_OR_DEPARTMENT_OTHER): Payer: Self-pay | Admitting: Emergency Medicine

## 2021-09-24 ENCOUNTER — Emergency Department (HOSPITAL_BASED_OUTPATIENT_CLINIC_OR_DEPARTMENT_OTHER): Payer: No Typology Code available for payment source

## 2021-09-24 ENCOUNTER — Other Ambulatory Visit: Payer: Self-pay

## 2021-09-24 DIAGNOSIS — M79662 Pain in left lower leg: Secondary | ICD-10-CM | POA: Insufficient documentation

## 2021-09-24 DIAGNOSIS — W19XXXA Unspecified fall, initial encounter: Secondary | ICD-10-CM

## 2021-09-24 DIAGNOSIS — M25562 Pain in left knee: Secondary | ICD-10-CM

## 2021-09-24 MED ORDER — DICLOFENAC SODIUM 1 % EX GEL: 2.0000 g | Freq: Four times a day (QID) | CUTANEOUS | 0 refills | Status: AC | PRN

## 2021-09-24 NOTE — ED Provider Notes (Signed)
?  Physical Exam  ?BP 117/66 (BP Location: Left Arm)   Pulse 84   Temp 98.8 ?F (37.1 ?C) (Oral)   Resp 20   Ht 5\' 7"  (1.702 m)   Wt 65.8 kg   SpO2 100%   BMI 22.71 kg/m?  ? ?Physical Exam ?Vitals and nursing note reviewed.  ?Constitutional:   ?   Appearance: Normal appearance.  ?HENT:  ?   Head: Normocephalic and atraumatic.  ?Eyes:  ?   Conjunctiva/sclera: Conjunctivae normal.  ?Pulmonary:  ?   Effort: Pulmonary effort is normal. No respiratory distress.  ?Skin: ?   General: Skin is warm and dry.  ?Neurological:  ?   Mental Status: He is alert.  ?Psychiatric:     ?   Mood and Affect: Mood normal.     ?   Behavior: Behavior normal.  ? ? ?Procedures  ?Procedures ? ?ED Course / MDM  ?  ?Medical Decision Making ?Amount and/or Complexity of Data Reviewed ?Radiology: ordered. ? ?Accepted handoff at shift change from [facility]. Please see prior provider note for full HPI. ? ?Briefly: Patient is 36 year old made who presents to the ER for left leg pain after fall 3 days ago. ? ?DDX/Plan: Plan at time of shift change was to follow up on patient's left knee x-ray and if this is normal, discharge to home with symptomatic relief of his pain.  ? ?DG Knee Complete 4 Views Left ? ?Result Date: 09/24/2021 ?CLINICAL DATA:  Injury going up stairs. Pain, stiffness of left knee EXAM: LEFT KNEE - COMPLETE 4+ VIEW COMPARISON:  None Available. FINDINGS: No evidence of fracture, dislocation, or joint effusion. No evidence of arthropathy or other focal bone abnormality. Soft tissues are unremarkable. IMPRESSION: Negative. Electronically Signed   By: 09/26/2021 M.D.   On: 09/24/2021 19:20   ? ?I personally interpreted patient's x-rays and I agree with the radiologist interpretation.  Plan to treat patient symptomatically with a knee immobilizer, prescription for Voltaren gel, and encourage over-the-counter anti-inflammatories.  Provided patient with contact information for orthopedic provider if his symptoms persist for him to  follow-up.  We discussed reasons to return to the emergency department, and patient is agreeable to the plan. ?  ?09/26/2021 ?09/24/21 1936 ? ?  ?09/26/21, MD ?09/24/21 2117 ? ?

## 2021-09-24 NOTE — Discharge Instructions (Addendum)
You were seen in the emergency department after a leg injury. ? ?As we discussed the x-ray looked normal today.  You either sprained your knee, or strained one of the muscles in your leg.  We have placed your knee in an immobilizer to see if this provides you any relief.  Also prescribing you Voltaren gel that you can use topically.  Continue using ibuprofen and Tylenol as needed. ? ?I have attached the contact information for the orthopedic doctor, for you to call and make an appointment if your symptoms persist. ?

## 2021-09-24 NOTE — ED Provider Notes (Signed)
?  [facility] EMERGENCY DEPARTMENT ?Provider Note ? ? ?CSN: [id number] ?Arrival date & time: 09/24/21  1733 ? ?  ? ?History ? ?Chief Complaint  ?Patient presents with  ? Leg Injury  ? ? ?Gabriel Myers is a 36 y.o. male. ? ?Pt complains of pain in his left leg and stumbling while going up steps.  Pt reports pain in upper leg all the way down.  Pt reports pain with walking  ? ?The history is provided by the patient. No language interpreter was used.  ?Knee Pain ?Location:  Knee ?Time since incident:  4 days ?Injury: no   ?Knee location:  R knee ?Pain details:  ?  Quality:  Aching ?  Radiates to:  Does not radiate ?  Severity:  Moderate ?  Onset quality:  Gradual ?  Duration:  4 days ?  Timing:  Constant ?  Progression:  Worsening ?Chronicity:  New ?Foreign body present:  No foreign bodies ?Relieved by:  Nothing ?Worsened by:  Nothing ?Ineffective treatments:  None tried ? ?  ? ?Home Medications ?Prior to Admission medications   ?Not on File  ?   ? ?Allergies    ?Patient has no known allergies.   ? ?Review of Systems   ?Review of Systems  ?All other systems reviewed and are negative. ? ?Physical Exam ?Updated Vital Signs ?BP 117/66 (BP Location: Left Arm)   Pulse 84   Temp 98.8 ?F (37.1 ?C) (Oral)   Resp 20   Ht 5\' 7"  (1.702 m)   Wt 65.8 kg   SpO2 100%   BMI 22.71 kg/m?  ?Physical Exam ?Vitals reviewed.  ?Cardiovascular:  ?   Rate and Rhythm: Normal rate.  ?Pulmonary:  ?   Effort: Pulmonary effort is normal.  ?Musculoskeletal:     ?   General: Swelling and tenderness present.  ?   Comments: Tender left knee,  small effusion  pain with exam,  negative drawer,    ?Skin: ?   General: Skin is warm.  ?Neurological:  ?   General: No focal deficit present.  ?   Mental Status: He is alert.  ?Psychiatric:     ?   Mood and Affect: Mood normal.  ? ? ?ED Results / Procedures / Treatments   ?Labs ?(all labs ordered are listed, but only abnormal results are displayed) ?Labs Reviewed - No data to  display ? ?EKG ?None ? ?Radiology ?No results found. ? ?Procedures ?Procedures  ? ? ?Medications Ordered in ED ?Medications - No data to display ? ?ED Course/ Medical Decision Making/ A&P ?  ?                        ?Medical Decision Making ?Amount and/or Complexity of Data Reviewed ?Radiology: ordered. ? ? ?PT's care turned over to [facility] at 7pm  ? ? ? ? ? ? ? ?Final Clinical Impression(s) / ED Diagnoses ?Final diagnoses:  ?None  ? ? ?Rx / DC Orders ?ED Discharge Orders   ? ? None  ? ?  ? ? ?  ?Fransico Meadow, [place] ?09/24/21 1902 ? ?  ?Lucrezia Starch, MD ?09/24/21 2117 ? ?

## 2021-09-24 NOTE — ED Triage Notes (Signed)
Pt tripped while going UP steps on Wednesday.  Pt states he now is having some left leg pain.  No head injury or LOC ?

## 2022-01-30 HISTORY — PX: DIRECT LARYNGOSCOPY: SHX5326

## 2022-12-31 ENCOUNTER — Emergency Department (HOSPITAL_BASED_OUTPATIENT_CLINIC_OR_DEPARTMENT_OTHER)
Admission: EM | Admit: 2022-12-31 | Discharge: 2022-12-31 | Disposition: A | Discharge status: Home / Self Care | Payer: 59 | Attending: Emergency Medicine | Admitting: Emergency Medicine

## 2022-12-31 ENCOUNTER — Other Ambulatory Visit: Payer: Self-pay

## 2022-12-31 ENCOUNTER — Emergency Department (HOSPITAL_BASED_OUTPATIENT_CLINIC_OR_DEPARTMENT_OTHER): Payer: 59

## 2022-12-31 ENCOUNTER — Encounter (HOSPITAL_BASED_OUTPATIENT_CLINIC_OR_DEPARTMENT_OTHER): Payer: Self-pay

## 2022-12-31 DIAGNOSIS — Z8521 Personal history of malignant neoplasm of larynx: Secondary | ICD-10-CM | POA: Insufficient documentation

## 2022-12-31 DIAGNOSIS — M25531 Pain in right wrist: Secondary | ICD-10-CM | POA: Insufficient documentation

## 2022-12-31 HISTORY — DX: Malignant (primary) neoplasm, unspecified: C80.1

## 2022-12-31 HISTORY — DX: Malignant neoplasm of larynx, unspecified: C32.9

## 2022-12-31 NOTE — ED Triage Notes (Signed)
R wrist pain. No obvious deformity. Denies injury.

## 2022-12-31 NOTE — Discharge Instructions (Signed)
We evaluated you for your wrist pain.  Your x-rays were negative.  I think the most likely cause of your symptoms is a wrist sprain.  The location where you are having pain can occasionally be a sign of a hidden injury (scaphoid fracture).  We have placed you in a splint.  Please follow-up with Dr. Victorino Dike..  Please take Tylenol and Motrin for your symptoms at home.  You can take 1000 mg of Tylenol every 6 hours and 600 mg of ibuprofen every 6 hours as needed for your symptoms.  You can take these medicines together as needed, either at the same time, or alternating every 3 hours.   If you have any new or worsening symptoms, please return to the emergency department for a reassessment.

## 2022-12-31 NOTE — ED Provider Notes (Signed)
[place] EM[facility]GENCY [facility] AT [facility] Provider Note  CSN: [id number] Arrival date & time: 12/31/22 2026  Chief Complaint(s) Wrist Pain  HPI Gabriel Myers is a 37 y.o. male with history of laryngeal cancer presenting with right wrist pain.  Does not remember any specific trauma.  Has been present for a week.  Taking ibuprofen without significant improvement.  No numbness or tingling or wounds.   Past Medical History Past Medical History:  Diagnosis Date   Cancer (HCC)    Laryngeal cancer [facility])    Urticaria    Patient Active Problem List   Diagnosis Date Noted   Right leg pain 01/02/2014   Right knee injury 04/22/2013   Home Medication(s) Prior to Admission medications   Medication Sig Start Date End Date Taking? Authorizing Provider  diclofenac Sodium (VOLTAREN) 1 % GEL Apply 2 g topically every 6 (six) hours as needed. 09/24/21   Roemhildt, Raynelle Dick                                                                                                                                    Past Surgical History History reviewed. No pertinent surgical history. Family History Family History  Problem Relation Age of Onset   Asthma Cousin    Diabetes Cousin    Urticaria Mother    Allergic rhinitis Sister    Sudden death Neg Hx    Hypertension Neg Hx    Hyperlipidemia Neg Hx    Heart attack Neg Hx     Social History Social History   Tobacco Use   Smoking status: Former    Types: Cigarettes   Smokeless tobacco: Never  Vaping Use   Vaping status: Never Used  Substance Use Topics   Alcohol use: No   Drug use: No   Allergies Patient has no known allergies.  Review of Systems Review of Systems  All other systems reviewed and are negative.   Physical Exam Vital Signs  I have reviewed the triage vital signs BP 128/72   Pulse 64   Temp 98.9 F (37.2 C) (Oral)   Resp 16   Ht 5\' 7"  (1.702 m)   Wt 64.9 kg   SpO2 97%   BMI 22.40 kg/m  Physical  Exam Vitals and nursing note reviewed.  Constitutional:      General: He is not in acute distress.    Appearance: Normal appearance.  HENT:     Head: Normocephalic and atraumatic.     Mouth/Throat:     Mouth: Mucous membranes are moist.  Eyes:     Conjunctiva/sclera: Conjunctivae normal.  Cardiovascular:     Rate and Rhythm: Normal rate.  Pulmonary:     Effort: Pulmonary effort is normal. No respiratory distress.  Abdominal:     General: Abdomen is flat.  Musculoskeletal:     Comments: Full range of motion of the right wrist, patient points to radial  aspect of wrist with localizing pain.  Does have scaphoid tenderness.  No other tenderness.  Skin:    General: Skin is warm and dry.     Capillary Refill: Capillary refill takes less than 2 seconds.  Neurological:     General: No focal deficit present.     Mental Status: He is alert. Mental status is at baseline.  Psychiatric:        Mood and Affect: Mood normal.        Behavior: Behavior normal.     ED Results and Treatments Labs (all labs ordered are listed, but only abnormal results are displayed) Labs Reviewed - No data to display                                                                                                                        Radiology DG Wrist Complete Right  Result Date: 12/31/2022 CLINICAL DATA:  Right wrist pain EXAM: RIGHT WRIST - COMPLETE 3+ VIEW COMPARISON:  None Available. FINDINGS: There is no evidence of fracture or dislocation. There is no evidence of arthropathy or other focal bone abnormality. Soft tissues are unremarkable. IMPRESSION: Negative. Electronically Signed   By: Minerva Fester M.D.   On: 12/31/2022 21:08    Pertinent labs & imaging results that were available during my care of the patient were reviewed by me and considered in my medical decision making (see MDM for details).  Medications Ordered in ED Medications - No data to display                                                                                                                                    Procedures .Ortho Injury Treatment  Date/Time: 12/31/2022 9:54 PM  Performed by: Lonell Grandchild, MD Authorized by: Lonell Grandchild, MD   Consent:    Consent obtained:  Verbal   Consent given by:  PatientInjury location: wrist Location details: right wrist Injury type: soft tissue Pre-procedure neurovascular assessment: neurovascularly intact Pre-procedure distal perfusion: normal Pre-procedure neurological function: normal Pre-procedure range of motion: normal  Anesthesia: Local anesthesia used: no  Patient sedated: NoImmobilization: splint Splint type: thumb spica Splint Applied by: ED Provider and ED Tech Post-procedure neurovascular assessment: post-procedure neurovascularly intact Post-procedure distal perfusion: normal Post-procedure neurological function: normal Post-procedure range of motion: normal     (including critical care time)  Medical Decision Making / ED Course  MDM:  37 year old male presenting to the emergency department with wrist pain.  Patient well-appearing, physical exam with no obvious injury or deformity.  No wounds.  Range of motion normal.  He does have some scaphoid tenderness, does not remember any specific trauma.  Would expect to see some kind of scaphoid fracture at this point but given tenderness will place in thumb spica splint and have him follow-up with a hand surgery.      Imaging Studies ordered: I ordered imaging studies including XR wrist On my interpretation imaging demonstrates no acute process I independently visualized and interpreted imaging. I agree with the radiologist interpretation   Medicines ordered and prescription drug management: No orders of the defined types were placed in this encounter.   -I have reviewed the patients home medicines and have made adjustments as needed   Co morbidities that complicate the  patient evaluation  Past Medical History:  Diagnosis Date   Cancer (HCC)    Laryngeal cancer (HCC)    Urticaria       Dispostion: Disposition decision including need for hospitalization was considered, and patient discharged from emergency department.    Final Clinical Impression(s) / ED Diagnoses Final diagnoses:  Right wrist pain     This chart was dictated using voice recognition software.  Despite best efforts to proofread,  errors can occur which can change the documentation meaning.    Lonell Grandchild, MD 12/31/22 2155

## 2023-03-02 ENCOUNTER — Encounter (HOSPITAL_BASED_OUTPATIENT_CLINIC_OR_DEPARTMENT_OTHER): Payer: Self-pay

## 2023-03-02 ENCOUNTER — Other Ambulatory Visit: Payer: Self-pay

## 2023-03-02 ENCOUNTER — Emergency Department (HOSPITAL_BASED_OUTPATIENT_CLINIC_OR_DEPARTMENT_OTHER)
Admission: EM | Admit: 2023-03-02 | Discharge: 2023-03-02 | Disposition: A | Discharge status: Home / Self Care | Payer: 59 | Attending: Emergency Medicine | Admitting: Emergency Medicine

## 2023-03-02 DIAGNOSIS — M654 Radial styloid tenosynovitis [de Quervain]: Secondary | ICD-10-CM

## 2023-03-02 DIAGNOSIS — M79645 Pain in left finger(s): Secondary | ICD-10-CM | POA: Diagnosis present

## 2023-03-02 NOTE — ED Provider Notes (Addendum)
[place] EM[facility] AT [facility] Provider Note   CSN: [id number] Arrival date & time: 03/02/23  1956     History Chief Complaint  Patient presents with   Wrist Pain    HPI Gabriel Myers is a 36 y.o. male presenting for pain at the base of his left thumb radiating into his wrist.  He is a Copy.  States that he was seen for this 2 months ago started on wrist bracing it seemed to help but he was lost to follow-up.  He lost the wrist brace.  States that he does multiple repetitive actions at work..   Patient's recorded medical, surgical, social, medication list and allergies were reviewed in the Snapshot window as part of the initial history.   Review of Systems   Review of Systems  Constitutional:  Negative for chills and fever.  HENT:  Negative for ear pain and sore throat.   Eyes:  Negative for pain and visual disturbance.  Respiratory:  Negative for cough and shortness of breath.   Cardiovascular:  Negative for chest pain and palpitations.  Gastrointestinal:  Negative for abdominal pain and vomiting.  Genitourinary:  Negative for dysuria and hematuria.  Musculoskeletal:  Negative for arthralgias and back pain.  Skin:  Negative for color change and rash.  Neurological:  Negative for seizures and syncope.  All other systems reviewed and are negative.   Physical Exam Updated Vital Signs BP 138/82   Pulse 62   Temp 98.8 F (37.1 C)   Resp 16   Ht 5\' 5"  (1.651 m)   Wt 64.9 kg   SpO2 100%   BMI 23.80 kg/m  Physical Exam Vitals and nursing note reviewed.  Constitutional:      General: He is not in acute distress.    Appearance: He is well-developed.  HENT:     Head: Normocephalic and atraumatic.  Eyes:     Conjunctiva/sclera: Conjunctivae normal.  Cardiovascular:     Rate and Rhythm: Normal rate and regular rhythm.  Pulmonary:     Effort: Pulmonary effort is normal. No respiratory distress.  Abdominal:     General: Abdomen is flat.  There is no distension.  Musculoskeletal:        General: No swelling or deformity.     Comments: Full range of motion of the right hand he does have tenderness with ulnar excursion of the wrist.  Skin:    General: Skin is warm and dry.     Capillary Refill: Capillary refill takes less than 2 seconds.  Neurological:     Mental Status: He is alert and oriented to person, place, and time. Mental status is at baseline.      ED Course/ Medical Decision Making/ A&P    Procedures Procedures   Medications Ordered in ED Medications - No data to display  Medical Decision Making:   Patient's history of present illness and physical exam findings are most consistent with de Quervain tenosynovitis.  Will place back in a wrist brace especially while at work, Tylenol/NSAIDs for pain control/supportive care at home.  Will refer back to orthopedic in the outpatient setting for further care and management.  Denied injury or any other concerning history.   Clinical Impression:  1. De Quervain's tenosynovitis, left      Discharge   Final Clinical Impression(s) / ED Diagnoses Final diagnoses:  De Quervain's tenosynovitis, left    Rx / DC Orders ED Discharge Orders     None  Glyn Ade, MD 03/02/23 2016    Glyn Ade, MD 03/02/23 2016

## 2023-03-02 NOTE — ED Triage Notes (Signed)
Nontraumatic right wrist pains.   Says seen here 2 months ago with pain after moving something heavy. Negative XR. Pain has bee intermittent ever since.   Today pain intensified.

## 2023-03-12 ENCOUNTER — Ambulatory Visit (INDEPENDENT_AMBULATORY_CARE_PROVIDER_SITE_OTHER): Payer: 59 | Admitting: Physician Assistant

## 2023-03-12 DIAGNOSIS — M654 Radial styloid tenosynovitis [de Quervain]: Secondary | ICD-10-CM | POA: Diagnosis not present

## 2023-03-12 MED ORDER — LIDOCAINE HCL 1 % IJ SOLN
1.0000 mL | INTRAMUSCULAR | Status: AC | PRN
Start: 2023-03-12 — End: 2023-03-12
  Administered 2023-03-12: 1 mL

## 2023-03-12 MED ORDER — METHYLPREDNISOLONE ACETATE 40 MG/ML IJ SUSP
40.0000 mg | INTRAMUSCULAR | Status: AC | PRN
Start: 2023-03-12 — End: 2023-03-12
  Administered 2023-03-12: 40 mg

## 2023-03-12 NOTE — Progress Notes (Signed)
HPI: [name] 37 year old male were seen for the first time for right wrist pain.  Has a history of larynx carcinoma.  He was first seen for his wrist pain in the ER in August this was after lifting a water jug.  He was diagnosed with de Quervain's tenosynovitis.  At that time he was given thumb spica wrist splints and told to take Tylenol and/NSAIDs for pain control.  He states his pain went away.  Then his pain came back after moving his daughter.  He states he is picking up on account pain came back in his right wrist.  He has been wearing the wrist splint.  Pain continues dorsal aspect of the first compartment right wrist.  No numbness tingling.  Describes the pain as sharp and constant.  He has been taking Tylenol. Radiographs from 12/31/22 are reviewed multiple views showed no evidence of acute injury.  No arthropathy.  Review of systems: Negative for fevers chills.  He is nondiabetic.  Physical exam: General Well-developed well-nourished male no acute distress mood and affect appropriate. Psych: Alert and oriented x 3  Right wrist: Tenderness over the first extensor compartment.  No tenderness at the [facility] joint.  Grind test is negative right thumb.  Finkelstein's positive on the right negative on the left.  Impression: Right wrist de Quervain's tenosynovitis  Plan: Recommend he continue to use the brace for at least the next 2 weeks.  Also start using Voltaren gel 2 g 4 times daily to the area of maximal tenderness.  Offered steroid injection he was agreeable therefore we proceeded with this.  After the injection Finkelstein's was negative.  He will follow-up with [place] in 2 weeks to see what type of relief he gained.  Questions were encouraged and answered    Procedure Note  Patient: Gabriel Myers             Date of Birth: Jul 14, 1985           MRN: [id number]             Visit Date: 03/12/2023  Procedures: Visit Diagnoses:  1. De Quervain's disease (tenosynovitis)     Hand/UE Inj: R  extensor compartment 1 for de Quervain's tenosynovitis on 03/12/2023 4:36 PM Details: dorsal approach Medications: 1 mL lidocaine 1 %; 40 mg methylPREDNISolone acetate 40 MG/ML Consent was given by the patient. Immediately prior to procedure a time out was called to verify the correct patient, procedure, equipment, support staff and site/side marked as required. Patient was prepped and draped in the usual sterile fashion.

## 2023-03-26 ENCOUNTER — Encounter: Payer: Self-pay | Admitting: Physician Assistant

## 2023-03-26 ENCOUNTER — Ambulatory Visit: Payer: 59 | Admitting: Physician Assistant

## 2023-03-26 DIAGNOSIS — M654 Radial styloid tenosynovitis [de Quervain]: Secondary | ICD-10-CM

## 2023-03-26 NOTE — Progress Notes (Signed)
HPI: Natrone returns today for right wrist pain.  He is given an injection on 03/12/2023 for de Quervain's syndrome.  He states that for 1 week he had no pain whatsoever in the wrist but then the pain came back not as severe.  He ranks pain to be 4 out of 10 to 6 out of 10 at worst.  He is using Tylenol Voltaren gel and thumb spica wrist splint.  He said no new injury.  Review of systems: See HPI otherwise negative  Physical exam: Right wrist tenderness over the first extensor compartment.  Positive Finkelstein's.  Grind test is negative.  Impression: Right  de Quervain's tenosynovitis  Plan: This point time we will refer him to hand surgery to see what other interventions they may suggest.  Did offer him therapy but he would like to see a hand surgeon first.  He will therefore follow-up with [place] as needed

## 2023-04-30 ENCOUNTER — Ambulatory Visit: Payer: 59 | Admitting: Orthopedic Surgery

## 2023-05-05 ENCOUNTER — Other Ambulatory Visit: Payer: Self-pay

## 2023-05-05 ENCOUNTER — Emergency Department (HOSPITAL_BASED_OUTPATIENT_CLINIC_OR_DEPARTMENT_OTHER): Payer: 59

## 2023-05-05 ENCOUNTER — Emergency Department (HOSPITAL_BASED_OUTPATIENT_CLINIC_OR_DEPARTMENT_OTHER)
Admission: EM | Admit: 2023-05-05 | Discharge: 2023-05-05 | Disposition: A | Discharge status: Home / Self Care | Payer: 59 | Attending: Emergency Medicine | Admitting: Emergency Medicine

## 2023-05-05 ENCOUNTER — Encounter (HOSPITAL_BASED_OUTPATIENT_CLINIC_OR_DEPARTMENT_OTHER): Payer: Self-pay

## 2023-05-05 DIAGNOSIS — M25512 Pain in left shoulder: Secondary | ICD-10-CM | POA: Diagnosis present

## 2023-05-05 MED ORDER — IBUPROFEN 600 MG PO TABS: 600.0000 mg | ORAL_TABLET | Freq: Four times a day (QID) | ORAL | 0 refills | Status: DC | PRN

## 2023-05-05 NOTE — ED Provider Notes (Signed)
[place] [facility] Provider Note   CSN: [id number] Arrival date & time: 05/05/23  1723     History  Chief Complaint  Patient presents with   Shoulder Pain    Gabriel Myers is a 37 y.o. male.   Shoulder Pain   37 year old male presents emergency department with complaints of left-sided shoulder pain.  Patient states that he has pain intermittently over the past few months.  Reports exacerbating symptoms earlier today after he picked up his daughter.  Noted dull pain in his left shoulder.  Reports pain worse with movements.  Denies any weakness or sensory deficits in left arm.  With any chest pain, shortness of breath.  Took no medication for his symptoms today.  States that last month when he had similar exacerbation when he was lifting up an object, took ibuprofen for a week and symptoms eventually resolved.  Feels like his left-sided collarbone is more noticeable than his right side and is concerned about potential dislocation.  Past medical history significant for the injury of Gabriel Myers, urticaria  Home Medications Prior to Admission medications   Medication Sig Start Date End Date Taking? Authorizing Provider  ibuprofen (ADVIL) 600 MG tablet Take 1 tablet (600 mg total) by mouth every 6 (six) hours as needed. 05/05/23  Yes Sherian Maroon A, PA  diclofenac Sodium (VOLTAREN) 1 % GEL Apply 2 g topically every 6 (six) hours as needed. 09/24/21   Roemhildt, Lorin T, PA-C      Allergies    Patient has no known allergies.    Review of Systems   Review of Systems  All other systems reviewed and are negative.   Physical Exam Updated Vital Signs BP 133/84 (BP Location: Right Arm)   Pulse 81   Temp 98.1 F (36.7 C) (Oral)   Resp 18   Ht 5\' 7"  (1.702 m)   Wt 64.9 kg   SpO2 100%   BMI 22.40 kg/m  Physical Exam Vitals and nursing note reviewed.  Constitutional:      General: He is not in acute distress.    Appearance: He is  well-developed.  HENT:     Head: Normocephalic and atraumatic.  Eyes:     Conjunctiva/sclera: Conjunctivae normal.  Cardiovascular:     Rate and Rhythm: Normal rate and regular rhythm.     Heart sounds: No murmur heard. Pulmonary:     Effort: Pulmonary effort is normal. No respiratory distress.     Breath sounds: Normal breath sounds.  Abdominal:     Palpations: Abdomen is soft.     Tenderness: There is no abdominal tenderness.  Musculoskeletal:        General: No swelling.     Cervical back: Neck supple.     Comments: Full range of motion of the shoulder but with pain.  Radial pulses 2+ bilaterally.  No overlying erythema, palpable fluctuance/induration.  No extremity pitting edema.  Tender to palpation left-sided AC joint.  No obvious appreciable visual deformity.  Pain exacerbated with empty can test, crossover adduction.  Skin:    General: Skin is warm and dry.     Capillary Refill: Capillary refill takes less than 2 seconds.  Neurological:     Mental Status: He is alert.  Psychiatric:        Mood and Affect: Mood normal.     ED Results / Procedures / Treatments   Labs (all labs ordered are listed, but only abnormal results are displayed) Labs Reviewed -  No data to display  EKG None  Radiology DG Shoulder Left Result Date: 05/05/2023 CLINICAL DATA:  Left shoulder pain intermittently over the past month. No injury. EXAM: LEFT SHOULDER - 2+ VIEW COMPARISON:  02/02/2012 FINDINGS: There is no evidence of fracture or dislocation. There is no evidence of arthropathy or other focal bone abnormality. Soft tissues are unremarkable. IMPRESSION: Negative. Electronically Signed   By: Elberta Fortis M.D.   On: 05/05/2023 18:16    Procedures Procedures    Medications Ordered in ED Medications - No data to display  ED Course/ Medical Decision Making/ A&P                                 Medical Decision Making Amount and/or Complexity of Data Reviewed Radiology:  ordered.  Risk Prescription drug management.   This patient presents to the ED for concern of shoulder pain, this involves an extensive number of treatment options, and is a complaint that carries with it a high risk of complications and morbidity.  The differential diagnosis includes fracture, transient sprain, dislocation, ligamentous/tendinous injury, neurovascular compromise, ischemic limb, septic arthritis, osteoarthritis, ACS, DVT, other   Co morbidities that complicate the patient evaluation  See HPI   Additional history obtained:  Additional history obtained from EMR External records from outside source obtained and reviewed including hospital records   Lab Tests:  N/a   Imaging Studies ordered:  I ordered imaging studies including left shoulder x-ray I independently visualized and interpreted imaging which showed no acute osseous abnormality I agree with the radiologist interpretation   Cardiac Monitoring: / EKG:  The patient was maintained on a cardiac monitor.  I personally viewed and interpreted the cardiac monitored which showed an underlying rhythm of: Sinus rhythm   Consultations Obtained:  N/a   Problem List / ED Course / Critical interventions / Medication management  Left shoulder pain Reevaluation of the patient showed that the patient stayed the same I have reviewed the patients home medicines and have made adjustments as needed   Social Determinants of Health:  Denies tobacco, licit drug use   Test / Admission - Considered:  Left shoulder pain Vitals signs within normal range and stable throughout visit. Imaging studies significant for: See above 37 year old male presents emergency department complaints of left-sided shoulder pain.  Pain became apparent after lifting of his daughter today.  On exam, patient with tenderness left-sided AC joint as well as most prominent reproducible pain with crossover adduction.  X-ray obtained which was  negative for any acute osseous abnormality.  No pulse deficits to suggest ischemic limb.  No upper extremity swelling/pitting edema concerning for DVT.  No overlying skin changes concerning for secondary infectious process.  Suspect AC joint sprain.  Will recommend symptomatic therapy as described in AVS and follow-up with orthopedics in the outpatient setting.  Treatment plan discussed at length with patient and he acknowledged understanding and agreed with the plan.  Patient overall well-appearing, afebrile in no acute distress. Worrisome signs and symptoms were discussed with the patient, and the patient acknowledged understanding to return to the ED if noticed. Patient was stable upon discharge.          Final Clinical Impression(s) / ED Diagnoses Final diagnoses:  Acute pain of left shoulder    Rx / DC Orders ED Discharge Orders          Ordered    ibuprofen (ADVIL) 600 MG tablet  Every 6 hours PRN        05/05/23 1821              Peter Garter, [place] 05/05/23 Berna Spare    Alvira Monday, MD 05/06/23 [phone]

## 2023-05-05 NOTE — ED Triage Notes (Signed)
Pt reports shoulder pain that has been intermittent over the last month. He states his bone is protruding in his shoulder. Denies injury. Full range of motion but is painful with movement.

## 2023-05-05 NOTE — Discharge Instructions (Signed)
As discussed, x-ray negative for any obvious fracture or dislocation.  Given area of tenderness, concern for [facility] joint sprain.  Recommend treatment of symptoms with rest, ice, anti-inflammatories for treatment of pain.  Will send him with shoulder sling to help with recovery.  Recommend regularly taking her arm out of the sling and performing range of motion/catching exercises.  Avoid excessive lifting heavy objects, repetitive motions of the left shoulder.  Attached is information for orthopedics for reevaluation.  Please do not hesitate to return if the worrisome signs and symptoms we discussed become apparent.

## 2023-05-21 ENCOUNTER — Ambulatory Visit: Payer: 59 | Admitting: Orthopedic Surgery

## 2023-05-22 ENCOUNTER — Ambulatory Visit (INDEPENDENT_AMBULATORY_CARE_PROVIDER_SITE_OTHER): Payer: 59 | Admitting: Orthopedic Surgery

## 2023-05-22 DIAGNOSIS — M654 Radial styloid tenosynovitis [de Quervain]: Secondary | ICD-10-CM

## 2023-05-22 NOTE — Progress Notes (Addendum)
 Gabriel Myers - 38 y.o. male MRN [id number]  Date of birth: 09-02-1985  Office Visit Note: Visit Date: 05/22/2023 PCP: System, Provider Not In Referred by: Gretta Bertrum ORN, PA-C  Subjective: No chief complaint on file.  HPI: Gabriel Myers is a pleasant 38 y.o. male who presents today for evaluation of ongoing radial sided right wrist pain that has been present now for approximately 4 months.  No significant injury described.  He was diagnosed with de Quervain's tenosynovitis, underwent injection by Tory Gretta, PA in October of this year with temporary relief.  He estimates for proxy 1 week his wrist felt very good, he did have some recurrence of symptoms over the past few months.  He has been utilizing a brace intermittently, he feels it is too bulky.  He has been referred to me for further evaluation and potential discussion regarding surgical management of his hand condition.  Pertinent ROS were reviewed with the patient and found to be negative unless otherwise specified above in HPI.   Visit Reason: right wrist Duration of symptoms: August 2024 Hand dominance: right Occupation:Janitor Diabetic: No Smoking: No Heart/Lung History:none Blood Thinners: none  Prior Testing/EMG: 12/31/22 Injections (Date): 03/12/23 Treatments: injection, brace Prior Surgery: none  Assessment & Plan: Visit Diagnoses: No diagnosis found.  Plan: Extensive discussion was had with the patient today regarding his right wrist de Quervain's tenosynovitis.  Clinically, he does have mild symptoms of de Quervain's, however he is able to tolerate range of motion of the thumb and his Finklestein's is only mildly positive on examination today.  We discussed etiology and pathophysiology of tenosynovitis of the first extensor compartment as well as all treatment modalities ranging from conservative to surgical.  From a conservative standpoint, we discussed ongoing bracing, potentially transitioning to a smaller less  bulky brace for more practical use, activity modification, anti-inflammatory medication and potential repeat injection.  For the time being, will have him fitted for a Comfort Cool brace to be utilized for the right wrist particular for heavy duty activity.  He can return approximate 6 weeks time for recheck, at that juncture, should his symptoms be persistent, we could consider repeat cortisone injection.  We did discuss the possibility for a surgical intervention in the future should his symptoms remain refractory to conservative care, we discussed the surgical treatment for de Quervain's tenosynovitis as well as the postoperative protocol and standard rehabilitation.  All risks and benefits of the surgery were discussed in detail as well today, patient expressed full understanding.  Follow-up: No follow-ups on file.   Meds & Orders: No orders of the defined types were placed in this encounter.  No orders of the defined types were placed in this encounter.    Procedures: No procedures performed      Clinical History: No specialty comments available.  He reports that he has quit smoking. His smoking use included cigarettes. He has never used smokeless tobacco. No results for input(s): HGBA1C, LABURIC in the last 8760 hours.  Objective:   Vital Signs: There were no vitals taken for this visit.  Physical Exam  Gen: Well-appearing, in no acute distress; non-toxic CV: Regular Rate. Well-perfused. Warm.  Resp: Breathing unlabored on room air; no wheezing. Psych: Fluid speech in conversation; appropriate affect; normal thought process  Ortho Exam General: Patient is well appearing and in no distress. Cervical spine mobility is full in all directions:  Skin and Muscle: No skin changes are apparent to upper extremities.  Muscle bulk and contour  normal, no signs of atrophy.     Range of Motion and Palpation Tests: Mobility is full about the elbows with flexion and extension.  Forearm  supination and pronation are 85/85 bilaterally.  Wrist flexion/extension is 75/65 bilateral.  Digital flexion and extension are full.    No cords or nodules are palpated.  No triggering is observed.   Finklestein test is mildly positive right side, mild pain with deep palpation, mild associated swelling and tenderness.    Neurologic, Vascular, Motor: Sensation is intact to light touch in the median/radial/ulnar distributions.   Fingers pink and well perfused.  Capillary refill is brisk.     Imaging: No results found.  Past Medical/Family/Surgical/Social History: Medications & Allergies reviewed per EMR, new medications updated. Patient Active Problem List   Diagnosis Date Noted   Right leg pain 01/02/2014   Right knee injury 04/22/2013   Past Medical History:  Diagnosis Date   Cancer (HCC)    Laryngeal cancer (HCC)    Urticaria    Family History  Problem Relation Age of Onset   Asthma Cousin    Diabetes Cousin    Urticaria Mother    Allergic rhinitis Sister    Sudden death Neg Hx    Hypertension Neg Hx    Hyperlipidemia Neg Hx    Heart attack Neg Hx    No past surgical history on file. Social History   Occupational History   Not on file  Tobacco Use   Smoking status: Former    Types: Cigarettes   Smokeless tobacco: Never  Vaping Use   Vaping status: Never Used  Substance and Sexual Activity   Alcohol use: No   Drug use: No   Sexual activity: Not on file    Mashonda Broski Estela) Arlinda, M.D. [facility] 8:30 AM

## 2023-05-22 NOTE — Addendum Note (Signed)
 Addended by: Samuella Cota on: 05/22/2023 08:43 AM   Modules accepted: Level of Service

## 2023-08-24 ENCOUNTER — Emergency Department (HOSPITAL_BASED_OUTPATIENT_CLINIC_OR_DEPARTMENT_OTHER)

## 2023-08-24 ENCOUNTER — Other Ambulatory Visit: Payer: Self-pay

## 2023-08-24 ENCOUNTER — Encounter (HOSPITAL_BASED_OUTPATIENT_CLINIC_OR_DEPARTMENT_OTHER): Payer: Self-pay | Admitting: Emergency Medicine

## 2023-08-24 ENCOUNTER — Emergency Department (HOSPITAL_BASED_OUTPATIENT_CLINIC_OR_DEPARTMENT_OTHER)
Admission: EM | Admit: 2023-08-24 | Discharge: 2023-08-24 | Disposition: A | Discharge status: Home / Self Care | Attending: Emergency Medicine | Admitting: Emergency Medicine

## 2023-08-24 DIAGNOSIS — M25531 Pain in right wrist: Secondary | ICD-10-CM | POA: Insufficient documentation

## 2023-08-24 IMAGING — DX DG KNEE COMPLETE 4+V*L*
4 series · 4 of 4 positions shown · non-contrast
Comparison: None Available.

CLINICAL DATA: Injury going up stairs. Pain, stiffness of left knee

EXAM:
LEFT KNEE - COMPLETE 4+ VIEW

[knee ap]
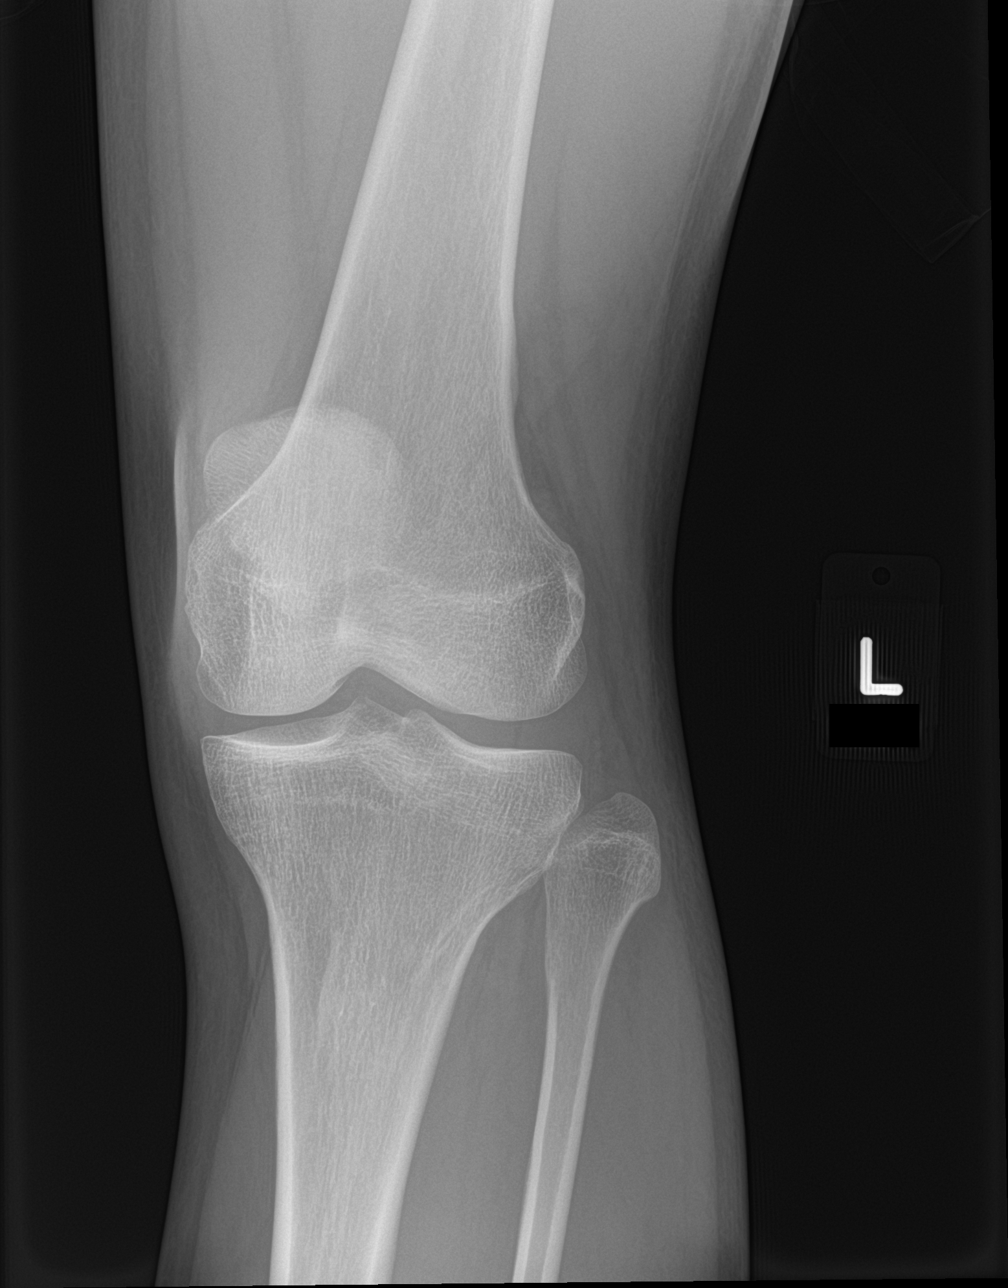

[knee lat]
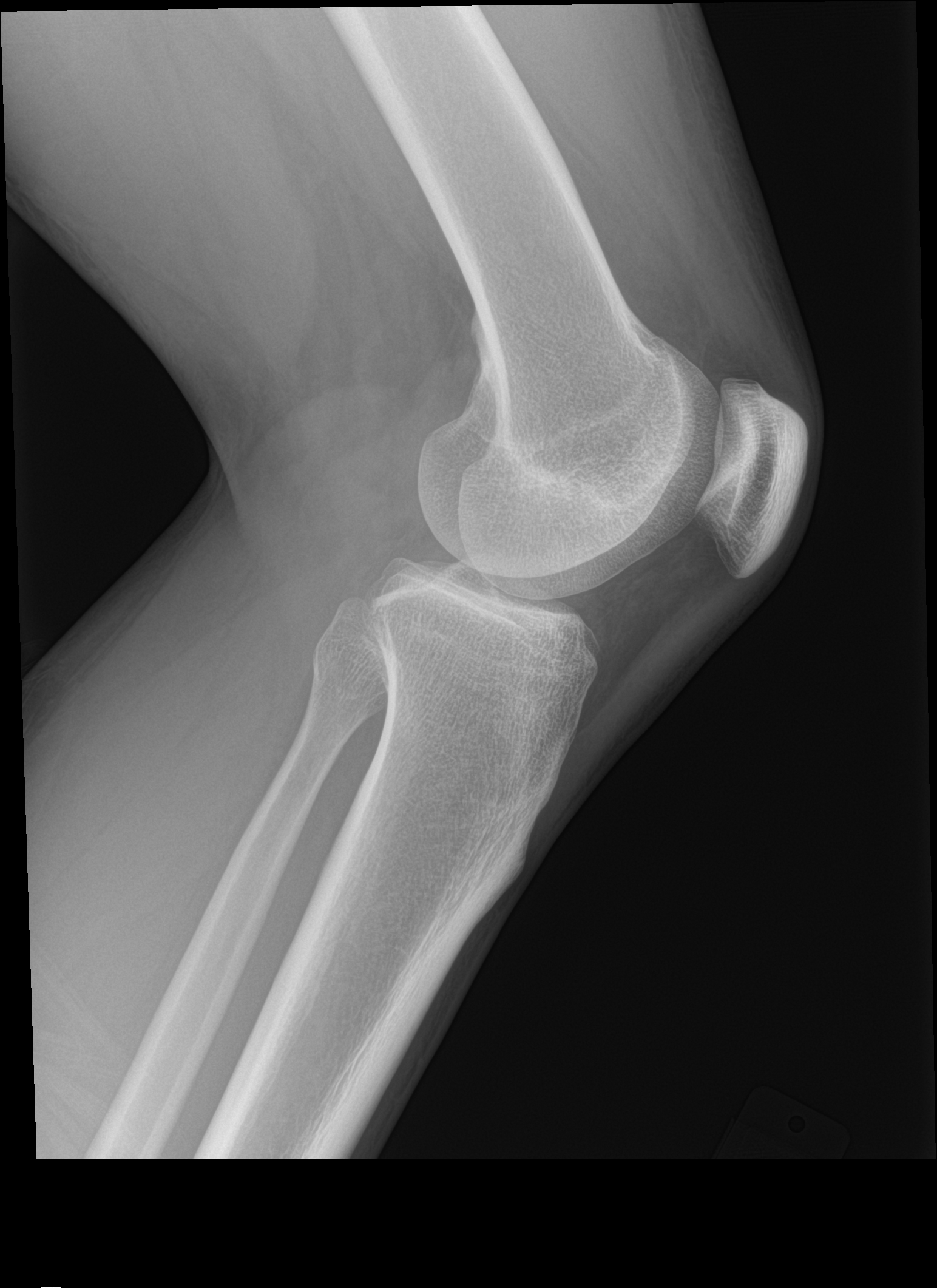

[knee obl (1 of 2)]
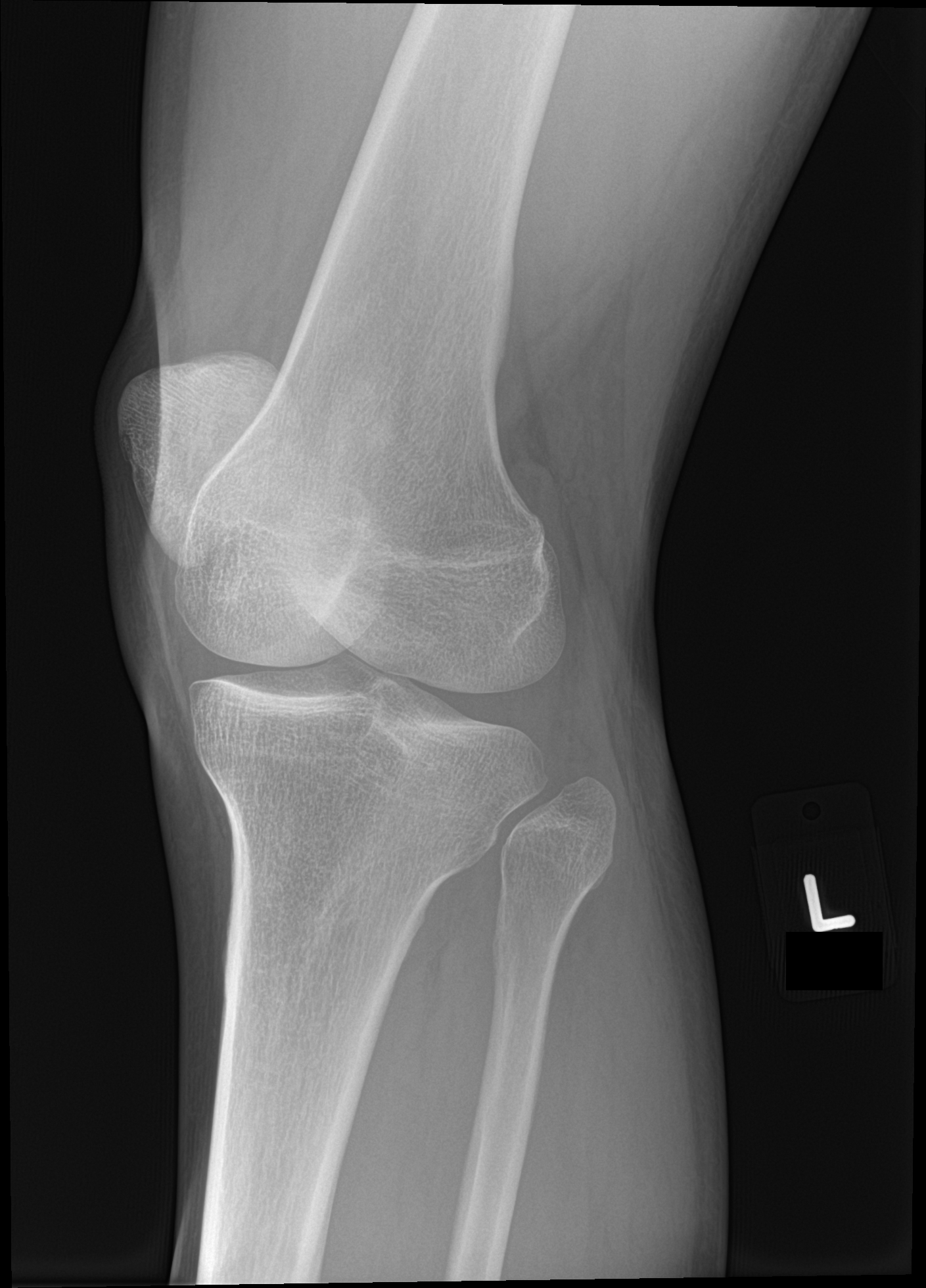

[knee obl (2 of 2)]
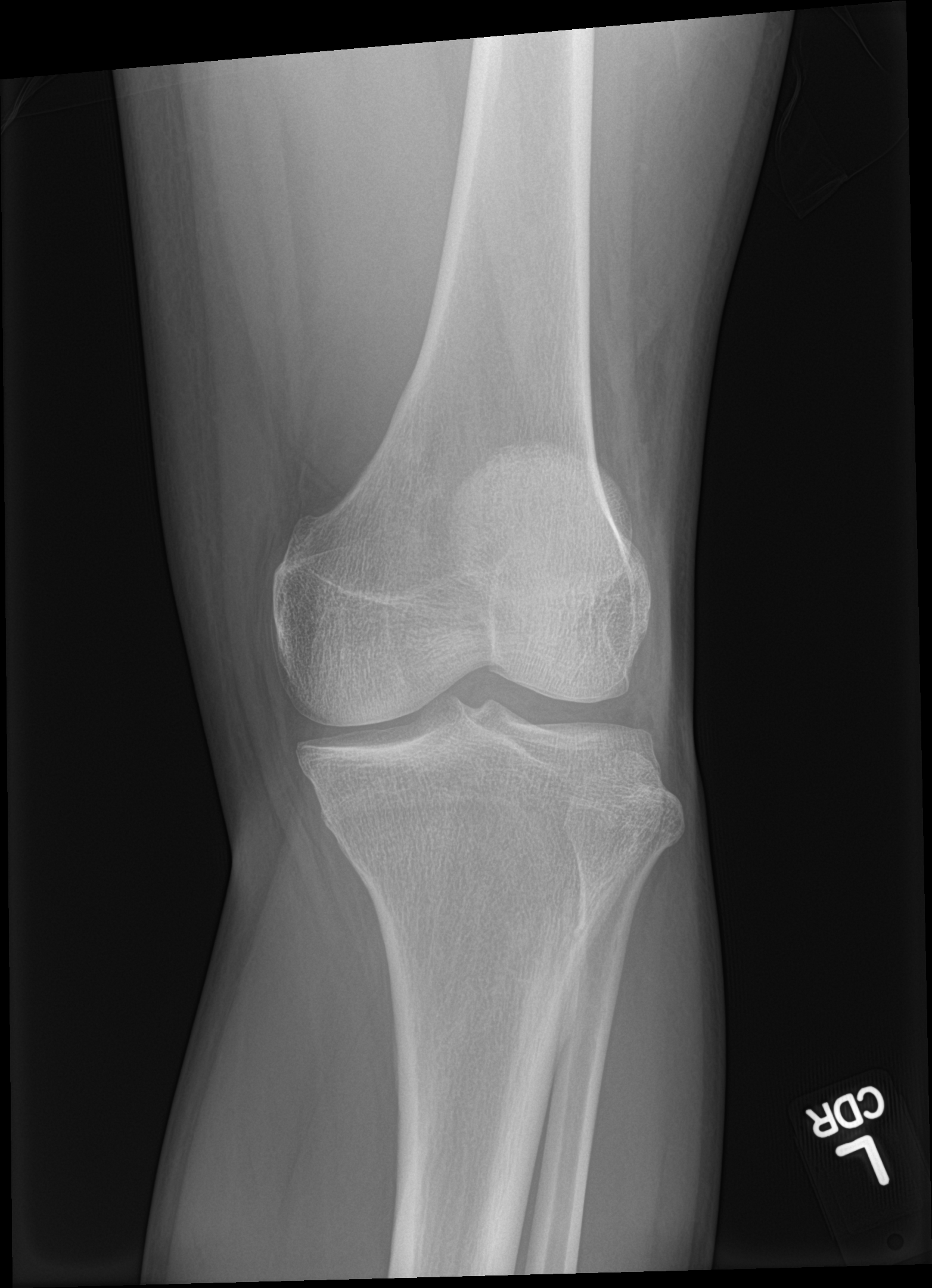

[4 of 4 positions shown; findings below may reference images not displayed]

FINDINGS: No evidence of fracture, dislocation, or joint effusion. No evidence
of arthropathy or other focal bone abnormality. Soft tissues are
unremarkable.
IMPRESSION: Negative.

## 2023-08-24 MED ORDER — HYDROCODONE-ACETAMINOPHEN 5-325 MG PO TABS
1.0000 | ORAL_TABLET | Freq: Four times a day (QID) | ORAL | 0 refills | Status: DC | PRN
Start: 2023-08-24 — End: 2024-01-04

## 2023-08-24 MED ORDER — HYDROCODONE-ACETAMINOPHEN 5-325 MG PO TABS
1.0000 | ORAL_TABLET | Freq: Four times a day (QID) | ORAL | 0 refills | Status: DC | PRN
Start: 2023-08-24 — End: 2023-08-24

## 2023-08-24 NOTE — ED Provider Notes (Addendum)
 [facility] Provider Note   CSN: [id number] Arrival date & time: 08/24/23  1930     History  Chief Complaint  Patient presents with   Wrist Pain    Gabriel Myers is a 38 y.o. male.  Patient with recurrent right wrist pain without fall or injury.  Was seen by orthopedics back in November.  He was being treated for de Quervain's tenosynovitis.  Did have an injection and things improved.  Now hurting again.  No pain to the fingers of the hand.  Good sensation in the fingers.  No numbness.  Has follow-up again with orthopedics scheduled in about a month.       Home Medications Prior to Admission medications   Medication Sig Start Date End Date Taking? Authorizing Provider  diclofenac Sodium (VOLTAREN) 1 % GEL Apply 2 g topically every 6 (six) hours as needed. 09/24/21   Roemhildt, Lorin T, PA-C  ibuprofen (ADVIL) 600 MG tablet Take 1 tablet (600 mg total) by mouth every 6 (six) hours as needed. 05/05/23   Peter Garter, PA      Allergies    Patient has no known allergies.    Review of Systems   Review of Systems  Constitutional:  Negative for chills and fever.  HENT:  Negative for ear pain and sore throat.   Eyes:  Negative for pain and visual disturbance.  Respiratory:  Negative for cough and shortness of breath.   Cardiovascular:  Negative for chest pain and palpitations.  Gastrointestinal:  Negative for abdominal pain and vomiting.  Genitourinary:  Negative for dysuria and hematuria.  Musculoskeletal:  Negative for arthralgias and back pain.  Skin:  Negative for color change and rash.  Neurological:  Negative for seizures and syncope.  All other systems reviewed and are negative.   Physical Exam Updated Vital Signs BP 125/79 (BP Location: Left Arm)   Pulse 66   Temp 98.9 F (37.2 C)   Resp 16   Ht 1.702 m (5\' 7" )   Wt 63.5 kg   SpO2 100%   BMI 21.93 kg/m  Physical Exam Vitals and nursing note reviewed.   Constitutional:      General: He is not in acute distress.    Appearance: He is well-developed.  HENT:     Head: Normocephalic and atraumatic.  Eyes:     Conjunctiva/sclera: Conjunctivae normal.  Cardiovascular:     Rate and Rhythm: Normal rate and regular rhythm.     Heart sounds: No murmur heard. Pulmonary:     Effort: Pulmonary effort is normal. No respiratory distress.     Breath sounds: Normal breath sounds.  Abdominal:     Palpations: Abdomen is soft.     Tenderness: There is no abdominal tenderness.  Musculoskeletal:        General: Tenderness present. No swelling.     Cervical back: Neck supple.     Comments: Patient with some tenderness to the right wrist.  Not classic for de Quervain's.  When you tap on the tendon there is pain but there is also pain when you tap other places.  Good cap refill to the finger.  Sensation intact.  No pain in the hand.  No pain with range of motion elbow or shoulder.  Skin:    General: Skin is warm and dry.     Capillary Refill: Capillary refill takes less than 2 seconds.  Neurological:     General: No focal deficit present.  Mental Status: He is alert and oriented to person, place, and time.  Psychiatric:        Mood and Affect: Mood normal.     ED Results / Procedures / Treatments   Labs (all labs ordered are listed, but only abnormal results are displayed) Labs Reviewed - No data to display  EKG None  Radiology No results found.  Procedures Procedures    Medications Ordered in ED Medications - No data to display  ED Course/ Medical Decision Making/ A&P                                 Medical Decision Making Amount and/or Complexity of Data Reviewed Radiology: ordered.   We will get x-ray of the right wrist.  Will probably treat him with splint.  Suspect patient may need short course of some pain medicine and then follow back up with hand surgery.  X-ray of the right wrist without any acute abnormalities.  Will  place in Velcro splint and have him follow back up with his orthopedic doctor.  Will provide prescription for pain medicine as needed.  Final Clinical Impression(s) / ED Diagnoses Final diagnoses:  Right wrist pain    Rx / DC Orders ED Discharge Orders     None         Vanetta Mulders, MD 08/24/23 2130    Vanetta Mulders, MD 08/24/23 2248

## 2023-08-24 NOTE — Discharge Instructions (Addendum)
 Wear the splint at all times.  Follow back up with your orthopedic hand surgeon in [place].  Take the hydrocodone as needed for pain.

## 2023-08-24 NOTE — ED Triage Notes (Addendum)
 Patient complaining of intermittent right wrist and hand pain for 1 year. Denies recent injuries. Given wrist brace when seen for same issue before but states there is no relief from pain with brace.

## 2023-08-29 ENCOUNTER — Ambulatory Visit (INDEPENDENT_AMBULATORY_CARE_PROVIDER_SITE_OTHER): Admitting: Orthopedic Surgery

## 2023-08-29 DIAGNOSIS — M654 Radial styloid tenosynovitis [de Quervain]: Secondary | ICD-10-CM

## 2023-08-29 NOTE — Progress Notes (Signed)
 Gabriel Myers - 38 y.o. male MRN [id number]  Date of birth: Oct 30, 1985  Office Visit Note: Visit Date: 08/29/2023 PCP: System, Provider Not In Referred by: No ref. provider found  Subjective: No chief complaint on file.  HPI: Gabriel Myers is a pleasant 38 y.o. male who returns today for evaluation of ongoing radial sided right wrist pain that has been present now for approximately 4 months.  No significant injury described.  He was diagnosed with de Quervain's tenosynovitis, underwent injection by Rexene Edison, PA in October of this year with temporary relief.    Pain has been intermittent in nature with occasional flareups.  Did have a recent significant flareup with notable wrist pain requiring a check in the emergency department setting.  X-rays at that time were negative, he was encouraged to return to the orthopedic hand clinic for follow-up.  Pertinent ROS were reviewed with the patient and found to be negative unless otherwise specified above in HPI.     Assessment & Plan: Visit Diagnoses:  1. De Quervain's disease (tenosynovitis)     Plan: Extensive discussion was once again had with the patient today regarding his right wrist de Quervain's tenosynovitis.  Clinically, he does have mild symptoms of de Quervain's, however he is able to tolerate range of motion of the thumb and his Finklestein's is only mildly positive on examination today.  We discussed etiology and pathophysiology of tenosynovitis of the first extensor compartment as well as all treatment modalities ranging from conservative to surgical.  From a conservative standpoint, we discussed ongoing bracing, potentially transitioning to a smaller less bulky brace for more practical use, activity modification, anti-inflammatory medication and potential repeat injection.  I did offer him cortisone injection today, however given that his symptoms are fairly mild on today's examination, we will refrain from injection at this time  and instead we will inject in the future should symptoms worsen.  We did discuss the possibility for a surgical intervention in the future should his symptoms remain refractory to conservative care, we discussed the surgical treatment for de Quervain's tenosynovitis as well as the postoperative protocol and standard rehabilitation.  All risks and benefits of the surgery were discussed in detail as well today, patient expressed full understanding.  Potentially, he would like to consider surgery in the August timeframe, I did explain that should his symptoms recur before then, we could consider repeat cortisone injection.  I did counsel him on the amount of cortisone injections that could be placed in this region before the potential for surgical intervention, being that his next injection would likely be the last one before we would consider surgery for refractory symptoms.  Follow-up: No follow-ups on file.   Meds & Orders: No orders of the defined types were placed in this encounter.  No orders of the defined types were placed in this encounter.    Procedures: No procedures performed      Clinical History: No specialty comments available.  He reports that he has quit smoking. His smoking use included cigarettes. He has never used smokeless tobacco. No results for input(s): "HGBA1C", "LABURIC" in the last 8760 hours.  Objective:   Vital Signs: There were no vitals taken for this visit.  Physical Exam  Gen: Well-appearing, in no acute distress; non-toxic CV: Regular Rate. Well-perfused. Warm.  Resp: Breathing unlabored on room air; no wheezing. Psych: Fluid speech in conversation; appropriate affect; normal thought process  Ortho Exam General: Patient is well appearing and in no distress.  Cervical spine mobility is full in all directions:  Skin and Muscle: No skin changes are apparent to upper extremities.  Muscle bulk and contour normal, no signs of atrophy.     Range of Motion and  Palpation Tests: Mobility is full about the elbows with flexion and extension.  Forearm supination and pronation are 85/85 bilaterally.  Wrist flexion/extension is 75/65 bilateral.  Digital flexion and extension are full.    No cords or nodules are palpated.  No triggering is observed.   Finklestein test is mildly positive right side, mild pain with deep palpation, mild associated swelling and tenderness.    Neurologic, Vascular, Motor: Sensation is intact to light touch in the median/radial/ulnar distributions.   Fingers pink and well perfused.  Capillary refill is brisk.     Imaging: No results found.  Past Medical/Family/Surgical/Social History: Medications & Allergies reviewed per EMR, new medications updated. Patient Active Problem List   Diagnosis Date Noted   Right leg pain 01/02/2014   Right knee injury 04/22/2013   Past Medical History:  Diagnosis Date   Cancer (HCC)    Laryngeal cancer (HCC)    Urticaria    Family History  Problem Relation Age of Onset   Asthma Cousin    Diabetes Cousin    Urticaria Mother    Allergic rhinitis Sister    Sudden death Neg Hx    Hypertension Neg Hx    Hyperlipidemia Neg Hx    Heart attack Neg Hx    No past surgical history on file. Social History   Occupational History   Not on file  Tobacco Use   Smoking status: Former    Types: Cigarettes   Smokeless tobacco: Never  Vaping Use   Vaping status: Never Used  Substance and Sexual Activity   Alcohol use: No   Drug use: No   Sexual activity: Not on file    Raine Elsass Merlinda Starling) Marce Sensing, M.D. [facility]

## 2023-11-30 ENCOUNTER — Ambulatory Visit (INDEPENDENT_AMBULATORY_CARE_PROVIDER_SITE_OTHER): Admitting: Orthopedic Surgery

## 2023-11-30 DIAGNOSIS — M654 Radial styloid tenosynovitis [de Quervain]: Secondary | ICD-10-CM | POA: Diagnosis not present

## 2023-11-30 NOTE — Progress Notes (Signed)
 Gabriel Myers - 38 y.o. male MRN [id number]  Date of birth: [birth year]  Office Visit Note: Visit Date: 11/30/2023 PCP: System, Provider Not In Referred by: Gretta Bertrum ORN, PA-C  Subjective: No chief complaint on file.  HPI: Gabriel Myers is a pleasant 38 y.o. male who returns today for evaluation of ongoing right de Quervain's tenosynovitis.  Symptoms have been present now for multiple months, refractory to conservative care in the form of bracing, activity modification and previous injections.  At this juncture, patient is interested in pursuing surgical invention.  Pertinent ROS were reviewed with the patient and found to be negative unless otherwise specified above in HPI.     Assessment & Plan: Visit Diagnoses:  1. De Quervain's disease (tenosynovitis)      Plan: Extensive discussion was once again had with the patient today regarding his right wrist de Quervain's tenosynovitis.  Clinically, he does have symptoms of de Quervain's, refractory to conservative care.  We discussed etiology and pathophysiology of tenosynovitis of the first extensor compartment as well as all treatment modalities and once again  We discussed the surgical treatment for de Quervain's tenosynovitis as well as the postoperative protocol and standard rehabilitation.  At this juncture he is indicated for right wrist first extensor compartment release.  All risks and benefits of the surgery were discussed in detail as well today, patient expressed full understanding.  He would like to have surgery performed at the end of August per his preference.  We will move forward with surgical scheduling of right wrist first extensor compartment release.   Follow-up: No follow-ups on file.   Meds & Orders: No orders of the defined types were placed in this encounter.  No orders of the defined types were placed in this encounter.    Procedures: No procedures performed      Clinical History: No specialty  comments available.  He reports that he has quit smoking. His smoking use included cigarettes. He has never used smokeless tobacco. No results for input(s): HGBA1C, LABURIC in the last 8760 hours.  Objective:   Vital Signs: There were no vitals taken for this visit.  Physical Exam  Gen: Well-appearing, in no acute distress; non-toxic CV: Regular Rate. Well-perfused. Warm.  Resp: Breathing unlabored on room air; no wheezing. Psych: Fluid speech in conversation; appropriate affect; normal thought process  Ortho Exam General: Patient is well appearing and in no distress.  Skin and Muscle: No skin changes are apparent to upper extremities.  Muscle bulk and contour normal, no signs of atrophy.     Range of Motion and Palpation Tests: Mobility is full about the elbows with flexion and extension.  Forearm supination and pronation are 85/85 bilaterally.  Wrist flexion/extension is 75/65 bilateral.  Digital flexion and extension are full.    No cords or nodules are palpated.  No triggering is observed.   Finklestein test is positive right side, pain with deep palpation, associated swelling and tenderness.    Neurologic, Vascular, Motor: Sensation is intact to light touch in the median/radial/ulnar distributions.   Fingers pink and well perfused.  Capillary refill is brisk.     Imaging: No results found.  Past Medical/Family/Surgical/Social History: Medications & Allergies reviewed per EMR, new medications updated. Patient Active Problem List   Diagnosis Date Noted   Right leg pain 01/02/2014   Right knee injury 04/22/2013   Past Medical History:  Diagnosis Date   Cancer (HCC)    Laryngeal cancer (HCC)    Urticaria  Family History  Problem Relation Age of Onset   Asthma Cousin    Diabetes Cousin    Urticaria Mother    Allergic rhinitis Sister    Sudden death Neg Hx    Hypertension Neg Hx    Hyperlipidemia Neg Hx    Heart attack Neg Hx    No past surgical history  on file. Social History   Occupational History   Not on file  Tobacco Use   Smoking status: Former    Types: Cigarettes   Smokeless tobacco: Never  Vaping Use   Vaping status: Never Used  Substance and Sexual Activity   Alcohol use: No   Drug use: No   Sexual activity: Not on file    Gabriel Myers Estela) Arlinda, M.D. [facility]

## 2023-12-04 ENCOUNTER — Ambulatory Visit (INDEPENDENT_AMBULATORY_CARE_PROVIDER_SITE_OTHER): Admitting: Internal Medicine

## 2023-12-04 ENCOUNTER — Encounter: Payer: Self-pay | Admitting: Internal Medicine

## 2023-12-04 VITALS — BP 118/84 | HR 75 | Temp 97.9°F | Resp 18 | Ht 67.0 in

## 2023-12-04 DIAGNOSIS — J3089 Other allergic rhinitis: Secondary | ICD-10-CM | POA: Diagnosis not present

## 2023-12-04 DIAGNOSIS — Z8521 Personal history of malignant neoplasm of larynx: Secondary | ICD-10-CM

## 2023-12-04 DIAGNOSIS — J4520 Mild intermittent asthma, uncomplicated: Secondary | ICD-10-CM | POA: Diagnosis not present

## 2023-12-04 DIAGNOSIS — L501 Idiopathic urticaria: Secondary | ICD-10-CM

## 2023-12-04 DIAGNOSIS — H1013 Acute atopic conjunctivitis, bilateral: Secondary | ICD-10-CM | POA: Diagnosis not present

## 2023-12-04 DIAGNOSIS — H1010 Acute atopic conjunctivitis, unspecified eye: Secondary | ICD-10-CM

## 2023-12-04 DIAGNOSIS — K219 Gastro-esophageal reflux disease without esophagitis: Secondary | ICD-10-CM

## 2023-12-04 MED ORDER — CETIRIZINE HCL 10 MG PO TABS: 10.0000 mg | ORAL_TABLET | Freq: Every day | ORAL | 5 refills | Status: AC

## 2023-12-04 MED ORDER — BUDESONIDE-FORMOTEROL FUMARATE 80-4.5 MCG/ACT IN AERO: 2.0000 | INHALATION_SPRAY | Freq: Two times a day (BID) | RESPIRATORY_TRACT | 12 refills | Status: AC

## 2023-12-04 MED ORDER — OLOPATADINE HCL 0.2 % OP SOLN: 1.0000 [drp] | Freq: Two times a day (BID) | OPHTHALMIC | 5 refills | Status: AC | PRN

## 2023-12-04 MED ORDER — FLUTICASONE PROPIONATE 50 MCG/ACT NA SUSP: 1.0000 | Freq: Every day | NASAL | 2 refills | Status: AC

## 2023-12-04 NOTE — Patient Instructions (Signed)
 Cough variant asthma, mild intermittent Symptoms include cough, wheezing, and shortness of breath, exacerbated by weather changes. Previous Breztri use was inappropriate. - Prescribed Symbicort  80mcg as needed, two puffs twice daily for cough, wheeze, and shortness of breath. - Instructed to track Symbicort  use and report if used more than 50% of the time. - Provided spacer for use with Symbicort . - Instructed to rinse mouth after using Symbicort  to prevent yeast infection and hoarseness.  Allergic rhinoconjunctivitis due to pollen Symptoms include stuffy nose and itchy, watery eyes, present year-round. Previous allergy testing showed allergy to different types of grass. - Start flonase  1 spray per nostril daily for nasal congestoin  - Start pataday  eye drops: 1 drop per eye as needed daily for eye symptoms  - Plan for follow-up allergy testing on July 28th (1-55)   -hold all antihistamines for 3 days prior   Chronic hives Hives occur rarely, lasting 5-15 minutes, not life-threatening but uncomfortable. Internal immune response, not external allergen reaction. - Recommended Zyrtec  10mg  twice daily as needed for hives until resolved.  History of Laryngeal cancer Associated with hoarseness.  Gastroesophageal reflux disease (GERD) Diagnosed GERD without significant reflux symptoms. Cough worse during the day, not at night, and not associated with certain foods. - will continue to monitor   Follow up: 7/28 at 3:30pm, hold all antihistamines for 3 days prior

## 2023-12-04 NOTE — Progress Notes (Signed)
 NEW PATIENT Date of Service/Encounter:  12/04/23 Referring provider: none-self referred Primary care provider: System, Provider Not In  Subjective:  Gabriel Myers is a 38 y.o. male  presenting today for evaluation of cough, rhinitis, urticaria  History obtained from: chart review and patient.   Discussed the use of AI scribe software for clinical note transcription with the patient, who gave verbal consent to proceed.  History of Present Illness Gabriel Myers is a 38 year old male with a history of laryngeal cancer who presents with chronic cough and allergy symptoms.  Chronic cough - Chronic cough present for approximately one year - Cough occurs intermittently and is exacerbated by changes in weather, exercise and illness - Cough is worse during the daytime - No association with specific foods - Cough syrup provides no relief - Uses Breztri as needed, approximately once or twice a week, no response - No prior spirometry - No prior history of inhalers other than Breztri  Allergic rhinitis and ocular symptoms - History of allergies confirmed by testing at [facility] three to four years ago, with sensitivity to various grasses - Year-round symptoms include nasal congestion and itchy, watery eyes - Uses eye drops for ocular symptoms - No use of daily antihistamines or nasal sprays  Chronic urticaria - History of chronic hives since 2019 - Hives occur more frequently than before but are less severe - Current episodes are rare, with the last episode about one and a half months ago - Each episode lasts approximately five to fifteen minutes - No medication taken for hives  Laryngeal symptoms and cancer history - History of laryngeal cancer - Persistent hoarseness  Gastroesophageal reflux symptoms - Diagnosed with acid reflux - No current medication for acid reflux  Allergy history - No known food or drug allergies - No stinging insect allergies - No history  of eczema    Chart Review:  [facility] 2019; spiro normal, started on high dose antihistamines for chronic hives   Other allergy screening: Asthma: chronic cough  Rhino conjunctivitis: yes Food allergy: no Medication allergy: no Hymenoptera allergy: no Urticaria: yes Eczema:no History of recurrent infections suggestive of immunodeficency: no Vaccinations are up to date.   Past Medical History: Past Medical History:  Diagnosis Date   Cancer (HCC)    Laryngeal cancer (HCC)    Urticaria    Medication List:  Current Outpatient Medications  Medication Sig Dispense Refill   budesonide -formoterol  (SYMBICORT ) 80-4.5 MCG/ACT inhaler Inhale 2 puffs into the lungs 2 (two) times daily. 1 each 12   cetirizine  (ZYRTEC ) 10 MG tablet Take 1 tablet (10 mg total) by mouth daily. 30 tablet 5   EPINEPHrine 0.3 mg/0.3 mL IJ SOAJ injection into the thigh.     fluticasone  (FLONASE ) 50 MCG/ACT nasal spray Place 1 spray into both nostrils daily. 16 g 2   Olopatadine  HCl (PATADAY ) 0.2 % SOLN Place 1 drop into both eyes 2 (two) times daily as needed. 2.5 mL 5   diclofenac  Sodium (VOLTAREN ) 1 % GEL Apply 2 g topically every 6 (six) hours as needed. (Patient not taking: Reported on 12/04/2023) 50 g 0   HYDROcodone -acetaminophen  (NORCO/VICODIN) 5-325 MG tablet Take 1 tablet by mouth every 6 (six) hours as needed for moderate pain (pain score 4-6). (Patient not taking: Reported on 12/04/2023) 12 tablet 0   ibuprofen  (ADVIL ) 600 MG tablet Take 1 tablet (600 mg total) by mouth every 6 (six) hours as needed. (Patient not taking: Reported on 12/04/2023) 30 tablet 0  No current facility-administered medications for this visit.   Known Allergies:  No Known Allergies Past Surgical History: Past Surgical History:  Procedure Laterality Date   WRIST SURGERY Right    01/11/24   Family History: Family History  Problem Relation Age of Onset   Asthma Cousin    Diabetes Cousin    Urticaria Mother    Allergic rhinitis  Sister    Sudden death Neg Hx    Hypertension Neg Hx    Hyperlipidemia Neg Hx    Heart attack Neg Hx    Social History: Gabriel Myers lives apartment, no water damage, concrete throughout.  Electric heating central cooling.  No animals inside the home.  No roaches inside the house and best to be off floor.  No dust mite precautions.  Not exposed to fumes, chemicals or dust.  No HEPA filter.  Works in Public affairs consultant  ROS:  All other systems negative except as noted per HPI.  Objective:  Blood pressure 118/84, pulse 75, temperature 97.9 F (36.6 C), temperature source Temporal, resp. rate 18, height 5' 7 (1.702 m), SpO2 99%. Body mass index is 21.93 kg/m. Physical Exam:  General Appearance:  Alert, cooperative, no distress, appears stated age  Head:  Normocephalic, without obvious abnormality, atraumatic  Eyes:  Conjunctiva clear, EOM's intact  Ears EACs normal bilaterally and normal TMs bilaterally  Nose: Nares normal, hypertrophic turbinates, normal mucosa, no visible anterior polyps, and septum midline  Throat: Lips, tongue normal; teeth and gums normal, normal posterior oropharynx  Neck: Supple, symmetrical  Lungs:   clear to auscultation bilaterally, Respirations unlabored, no coughing  Heart:  regular rate and rhythm and no murmur, Appears well perfused  Extremities: No edema  Skin: Skin color, texture, turgor normal and no rashes or lesions on visualized portions of skin  Neurologic: No gross deficits   Diagnostics: Spirometry:  Tracings reviewed. His effort: Good reproducible efforts. FVC: 3.23L (pre),  FEV1: 2.72L, 82% predicted (pre),  FEV1/FVC ratio: 84 (pre),  Interpretation: Spirometry consistent with normal pattern.  Please see scanned spirometry results for details.   Labs:  Lab Orders  No laboratory test(s) ordered today     Assessment and Plan  Assessment and Plan Assessment & Plan Cough variant asthma, mild intermittent Symptoms include cough,  wheezing, and shortness of breath, exacerbated by weather changes. Previous Breztri use was inappropriate. - Prescribed Symbicort  80mcg as needed, two puffs twice daily for cough, wheeze, and shortness of breath. - Instructed to track Symbicort  use and report if used more than 50% of the time. - Provided spacer for use with Symbicort . - Instructed to rinse mouth after using Symbicort  to prevent yeast infection and hoarseness.  Allergic rhinoconjunctivitis due to pollen Symptoms include stuffy nose and itchy, watery eyes, present year-round. Previous allergy testing showed allergy to different types of grass. - Start flonase  1 spray per nostril daily for nasal congestoin  - Start pataday  eye drops: 1 drop per eye as needed daily for eye symptoms  - Plan for follow-up allergy testing on July 28th (1-55)   -hold all antihistamines for 3 days prior   Chronic hives Hives occur rarely, lasting 5-15 minutes, not life-threatening but uncomfortable. Internal immune response, not external allergen reaction. - Recommended Zyrtec  10mg  twice daily as needed for hives until resolved.  History of Laryngeal cancer Associated with hoarseness.  Gastroesophageal reflux disease (GERD) Diagnosed GERD without significant reflux symptoms. Cough worse during the day, not at night, and not associated with certain foods. - will continue  to monitor   Follow up: 7/28 at 3:30pm, hold all antihistamines for 3 days prior     This note in its entirety was forwarded to the Provider who requested this consultation.  Other: reviewed spirometry technique and reviewed inhaler technique  Thank you for your kind referral. I appreciate the opportunity to take part in Prabhjot's care. Please do not hesitate to contact me with questions.  Sincerely,  Thank you so much for letting me partake in your care today.  Don't hesitate to reach out if you have any additional concerns!  Hargis Springer, MD  [facility] [place], [place]

## 2023-12-10 ENCOUNTER — Ambulatory Visit (INDEPENDENT_AMBULATORY_CARE_PROVIDER_SITE_OTHER): Admitting: Internal Medicine

## 2023-12-10 ENCOUNTER — Ambulatory Visit: Admitting: Orthopedic Surgery

## 2023-12-10 DIAGNOSIS — Z8521 Personal history of malignant neoplasm of larynx: Secondary | ICD-10-CM

## 2023-12-10 DIAGNOSIS — J3089 Other allergic rhinitis: Secondary | ICD-10-CM

## 2023-12-10 DIAGNOSIS — J4520 Mild intermittent asthma, uncomplicated: Secondary | ICD-10-CM

## 2023-12-10 DIAGNOSIS — L501 Idiopathic urticaria: Secondary | ICD-10-CM

## 2023-12-10 DIAGNOSIS — K219 Gastro-esophageal reflux disease without esophagitis: Secondary | ICD-10-CM

## 2023-12-10 DIAGNOSIS — H1010 Acute atopic conjunctivitis, unspecified eye: Secondary | ICD-10-CM

## 2023-12-10 NOTE — Patient Instructions (Addendum)
 Cough variant asthma, mild intermittent Symptoms include cough, wheezing, and shortness of breath, exacerbated by weather changes. Previous Breztri use was inappropriate. - Continue Symbicort  80mcg as needed, two puffs twice daily for cough, wheeze, and shortness of breath. - Track Symbicort  use and report if used more than 50% of the time. - Provided spacer for use with Symbicort . - Instructed to rinse mouth after using Symbicort  to prevent yeast infection and hoarseness.  Allergic rhinoconjunctivitis due to pollen Symptoms include stuffy nose and itchy, watery eyes, present year-round. Previous allergy  testing showed allergy  to different types of grass. - Continue flonase  1 spray per nostril daily for nasal congestoin  - Continue  pataday  eye drops: 1 drop per eye as needed daily for eye symptoms  Allergy  test ( 12/10/23): Negative to all environmentals and select food  Chronic hives Hives occur rarely, lasting 5-15 minutes, not life-threatening but uncomfortable. Internal immune response, not external allergen reaction. - Recommended Zyrtec  10mg  twice daily as needed for hives until resolved.  History of Laryngeal cancer Associated with hoarseness.  Gastroesophageal reflux disease (GERD) Diagnosed GERD without significant reflux symptoms. Cough worse during the day, not at night, and not associated with certain foods. - will continue to monitor   Follow up:  6 months

## 2023-12-10 NOTE — Progress Notes (Signed)
 Date of Service/Encounter:  12/10/23  Allergy  testing appointment   Initial visit on 12/04/23, seen for asthma, rhinitis, GERD .  Please see that note for additional details.  Today reports for allergy  diagnostic testing:    DIAGNOSTICS:  Skin Testing: Environmental allergy  panel. Adequate positive and negative controls Results discussed with patient/family.  Airborne Adult Perc - 12/10/23 1530     Time Antigen Placed 1530    Allergen Manufacturer Jestine    Location Back    Number of Test 55    Panel 1 Select    1. Control-Buffer 50% Glycerol Negative    2. Control-Histamine 4+   14x9   3. Bahia Negative    4. French Southern Territories Negative    5. Johnson Negative    6. Kentucky  Blue Negative    7. Meadow Fescue Negative    8. Perennial Rye Negative    9. Timothy Negative    10. Ragweed Mix Negative    11. Cocklebur Negative    12. Plantain,  English Negative    13. Baccharis Negative    14. Dog Fennel Negative    15. Russian Thistle Negative    16. Lamb's Quarters Negative    17. Sheep Sorrell Negative    18. Rough Pigweed Negative    19. Marsh Elder, Rough Negative    20. Mugwort, Common Negative    21. Box, Elder Negative    22. Cedar, red Negative    23. Sweet Gum Negative    24. Pecan Pollen Negative    25. Pine Mix Negative    26. Walnut, Black Pollen Negative    27. Red Mulberry Negative    28. Ash Mix Negative    29. Birch Mix Negative    30. Beech American Negative    31. Cottonwood, Guinea-Bissau Negative    32. Hickory, White Negative    33. Maple Mix Negative    34. Oak, Guinea-[place]issau Mix Negative    35. Sycamore Eastern Negative    36. Alternaria Alternata Negative    37. Cladosporium Herbarum Negative    38. Aspergillus Mix Negative    39. Penicillium Mix Negative    40. Bipolaris Sorokiniana (Helminthosporium) Negative    41. Drechslera Spicifera (Curvularia) Negative    42. Mucor Plumbeus Negative    43. Fusarium Moniliforme Negative    44. Aureobasidium Pullulans  (pullulara) Negative    45. Rhizopus Oryzae Negative    46. Botrytis Cinera Negative    47. Epicoccum Nigrum Negative    48. Phoma Betae Negative    49. Dust Mite Mix Negative    50. Cat Hair 10,000 BAU/ml Negative    51.  Dog Epithelia Negative    52. Mixed Feathers Negative    53. Horse Epithelia Negative    54. Cockroach, German Negative    55. Tobacco Leaf Negative          13 Food Perc - 12/10/23 1531       Test Information   Time Antigen Placed 1531    Allergen Manufacturer Jestine    Location Back    Number of allergen test 13      Food   1. Peanut Negative    2. Soybean Negative    3. Wheat Negative    4. Sesame Negative    5. Milk, Cow Negative    6. Casein Negative    7. Egg White, Chicken Negative    8. Shellfish Mix Negative    9. Fish Mix Negative  10. Cashew Negative    11. Walnut Food Negative    12. Almond Negative    13. Hazelnut Negative          Intradermal - 12/10/23 1624     Allergen Manufacturer Jestine    Location Arm    Number of Test 16    Control Negative    Bahia Negative    French Southern Territories Negative    Johnson Negative    7 Grass Negative    Ragweed Mix Negative    Weed Mix Negative    Tree Mix Negative    Mold 1 Negative    Mold 2 Negative    Mold 3 Negative    Mold 4 Negative    Mite Mix Negative    Cat Negative    Dog Negative    Cockroach Negative          Allergy  testing results were read and interpreted by myself, documented by clinical staff.  Patient provided with copy of allergy  testing along with avoidance measures when indicated.   Hargis Springer, MD  [facility] 

## 2024-01-04 ENCOUNTER — Other Ambulatory Visit: Payer: Self-pay

## 2024-01-04 ENCOUNTER — Encounter (HOSPITAL_BASED_OUTPATIENT_CLINIC_OR_DEPARTMENT_OTHER): Payer: Self-pay | Admitting: Orthopedic Surgery

## 2024-01-04 NOTE — Progress Notes (Signed)
   01/04/24 1207  PAT Phone Screen  Is the patient taking a GLP-1 receptor agonist? No  Do You Have Diabetes? No  Do You Have Hypertension? No  Have You Ever Been to the ER for Asthma? No  Have You Taken Oral Steroids in the Past 3 Months? No  Do you Take Phenteramine or any Other Diet Drugs? No  Recent  Lab Work, EKG, CXR? No  Do you have a history of heart problems? No  Any Recent Hospitalizations? No  Height 5' 7 (1.702 m)  Weight 63.5 kg  Pat Appointment Scheduled No  Reason for No Appointment Not Needed   Pt's hx of laryngeal cancer and previous anesthesia record reviewed with Dr Patrisha. May continue as planned with upcoming surgery at [facility].

## 2024-01-08 ENCOUNTER — Other Ambulatory Visit: Payer: Self-pay

## 2024-01-08 DIAGNOSIS — M654 Radial styloid tenosynovitis [de Quervain]: Secondary | ICD-10-CM

## 2024-01-11 ENCOUNTER — Ambulatory Visit (HOSPITAL_BASED_OUTPATIENT_CLINIC_OR_DEPARTMENT_OTHER): Payer: Self-pay | Admitting: Anesthesiology

## 2024-01-11 ENCOUNTER — Ambulatory Visit (HOSPITAL_BASED_OUTPATIENT_CLINIC_OR_DEPARTMENT_OTHER)
Admission: RE | Admit: 2024-01-11 | Discharge: 2024-01-11 | Disposition: A | Discharge status: Home / Self Care | Attending: Orthopedic Surgery | Admitting: Orthopedic Surgery

## 2024-01-11 ENCOUNTER — Encounter (HOSPITAL_BASED_OUTPATIENT_CLINIC_OR_DEPARTMENT_OTHER)
Admission: RE | Disposition: A | Discharge status: Home / Self Care | Payer: Self-pay | Source: Home / Self Care | Attending: Orthopedic Surgery

## 2024-01-11 ENCOUNTER — Encounter (HOSPITAL_BASED_OUTPATIENT_CLINIC_OR_DEPARTMENT_OTHER): Payer: Self-pay | Admitting: Orthopedic Surgery

## 2024-01-11 ENCOUNTER — Other Ambulatory Visit: Payer: Self-pay

## 2024-01-11 DIAGNOSIS — J45909 Unspecified asthma, uncomplicated: Secondary | ICD-10-CM

## 2024-01-11 DIAGNOSIS — Z7951 Long term (current) use of inhaled steroids: Secondary | ICD-10-CM | POA: Diagnosis not present

## 2024-01-11 DIAGNOSIS — K219 Gastro-esophageal reflux disease without esophagitis: Secondary | ICD-10-CM | POA: Diagnosis not present

## 2024-01-11 DIAGNOSIS — Z87891 Personal history of nicotine dependence: Secondary | ICD-10-CM

## 2024-01-11 DIAGNOSIS — Z5941 Food insecurity: Secondary | ICD-10-CM | POA: Insufficient documentation

## 2024-01-11 DIAGNOSIS — M654 Radial styloid tenosynovitis [de Quervain]: Secondary | ICD-10-CM | POA: Diagnosis present

## 2024-01-11 HISTORY — DX: Unspecified asthma, uncomplicated: J45.909

## 2024-01-11 HISTORY — PX: REPAIR EXTENSOR TENDON: SHX5382

## 2024-01-11 HISTORY — DX: Gastro-esophageal reflux disease without esophagitis: K21.9

## 2024-01-11 SURGERY — REPAIR, TENDON, EXTENSOR
Anesthesia: Monitor Anesthesia Care | Site: Wrist | Laterality: Right

## 2024-01-11 MED ORDER — SODIUM CHLORIDE 0.9 % IR SOLN
Status: DC | PRN
  Administered 2024-01-11: 200 mL

## 2024-01-11 MED ORDER — OXYCODONE HCL 5 MG/5ML PO SOLN: 5.0000 mg | Freq: Once | ORAL | Status: DC | PRN

## 2024-01-11 MED ORDER — AMISULPRIDE (ANTIEMETIC) 5 MG/2ML IV SOLN: 10.0000 mg | Freq: Once | INTRAVENOUS | Status: DC | PRN

## 2024-01-11 MED ORDER — CEFAZOLIN SODIUM-DEXTROSE 2-3 GM-%(50ML) IV SOLR
INTRAVENOUS | Status: DC | PRN
  Administered 2024-01-11: 2 g via INTRAVENOUS

## 2024-01-11 MED ORDER — FENTANYL CITRATE (PF) 250 MCG/5ML IJ SOLN
INTRAMUSCULAR | Status: DC | PRN
  Administered 2024-01-11: 50 ug via INTRAVENOUS

## 2024-01-11 MED ORDER — LACTATED RINGERS IV SOLN: INTRAVENOUS | Status: DC

## 2024-01-11 MED ORDER — EPHEDRINE 5 MG/ML INJ
INTRAVENOUS | Status: AC
  Filled 2024-01-11: qty 5

## 2024-01-11 MED ORDER — DEXAMETHASONE SODIUM PHOSPHATE 10 MG/ML IJ SOLN
INTRAMUSCULAR | Status: AC
  Filled 2024-01-11: qty 3

## 2024-01-11 MED ORDER — DEXMEDETOMIDINE HCL IN NACL 80 MCG/20ML IV SOLN
INTRAVENOUS | Status: AC
  Filled 2024-01-11: qty 20

## 2024-01-11 MED ORDER — ONDANSETRON HCL 4 MG/2ML IJ SOLN
INTRAMUSCULAR | Status: DC | PRN
  Administered 2024-01-11: 4 mg via INTRAVENOUS

## 2024-01-11 MED ORDER — ACETAMINOPHEN 500 MG PO TABS
ORAL_TABLET | ORAL | Status: AC
  Filled 2024-01-11: qty 2

## 2024-01-11 MED ORDER — PHENYLEPHRINE 80 MCG/ML (10ML) SYRINGE FOR IV PUSH (FOR BLOOD PRESSURE SUPPORT)
PREFILLED_SYRINGE | INTRAVENOUS | Status: AC
  Filled 2024-01-11: qty 10

## 2024-01-11 MED ORDER — LIDOCAINE-EPINEPHRINE (PF) 1 %-1:200000 IJ SOLN
INTRAMUSCULAR | Status: DC | PRN
Start: 2024-01-11 — End: 2024-01-11
  Administered 2024-01-11: 10 mL via INTRADERMAL

## 2024-01-11 MED ORDER — OXYCODONE HCL 5 MG PO TABS: 5.0000 mg | ORAL_TABLET | Freq: Once | ORAL | Status: DC | PRN

## 2024-01-11 MED ORDER — MIDAZOLAM HCL 2 MG/2ML IJ SOLN
INTRAMUSCULAR | Status: AC
  Filled 2024-01-11: qty 2

## 2024-01-11 MED ORDER — FENTANYL CITRATE (PF) 100 MCG/2ML IJ SOLN
INTRAMUSCULAR | Status: AC
  Filled 2024-01-11: qty 2

## 2024-01-11 MED ORDER — PROPOFOL 500 MG/50ML IV EMUL
INTRAVENOUS | Status: DC | PRN
  Administered 2024-01-11: 100 ug/kg/min via INTRAVENOUS

## 2024-01-11 MED ORDER — OXYCODONE HCL 5 MG PO TABS: 5.0000 mg | ORAL_TABLET | Freq: Four times a day (QID) | ORAL | 0 refills | Status: AC | PRN

## 2024-01-11 MED ORDER — ONDANSETRON HCL 4 MG/2ML IJ SOLN
INTRAMUSCULAR | Status: AC
  Filled 2024-01-11: qty 6

## 2024-01-11 MED ORDER — ACETAMINOPHEN 500 MG PO TABS
1000.0000 mg | ORAL_TABLET | Freq: Once | ORAL | Status: AC
  Administered 2024-01-11: 1000 mg via ORAL

## 2024-01-11 MED ORDER — CEFAZOLIN SODIUM-DEXTROSE 2-4 GM/100ML-% IV SOLN
INTRAVENOUS | Status: AC
  Filled 2024-01-11: qty 100

## 2024-01-11 MED ORDER — MIDAZOLAM HCL 2 MG/2ML IJ SOLN
INTRAMUSCULAR | Status: DC | PRN
  Administered 2024-01-11: 2 mg via INTRAVENOUS

## 2024-01-11 MED ORDER — FENTANYL CITRATE (PF) 100 MCG/2ML IJ SOLN
INTRAMUSCULAR | Status: AC
Start: 2024-01-11 — End: 2024-01-11
  Filled 2024-01-11: qty 2

## 2024-01-11 MED ORDER — DEXAMETHASONE SODIUM PHOSPHATE 10 MG/ML IJ SOLN
INTRAMUSCULAR | Status: DC | PRN
  Administered 2024-01-11: 10 mg via INTRAVENOUS

## 2024-01-11 MED ORDER — FENTANYL CITRATE (PF) 100 MCG/2ML IJ SOLN: 25.0000 ug | INTRAMUSCULAR | Status: DC | PRN

## 2024-01-11 MED ORDER — KETOROLAC TROMETHAMINE 30 MG/ML IJ SOLN
INTRAMUSCULAR | Status: AC
  Filled 2024-01-11: qty 1

## 2024-01-11 MED ORDER — LIDOCAINE-EPINEPHRINE 1 %-1:100000 IJ SOLN
INTRAMUSCULAR | Status: AC
Start: 2024-01-11 — End: 2024-01-11
  Filled 2024-01-11: qty 1

## 2024-01-11 MED ORDER — CEFAZOLIN SODIUM-DEXTROSE 2-4 GM/100ML-% IV SOLN
2.0000 g | INTRAVENOUS | Status: DC
Start: 2024-01-11 — End: 2024-01-11

## 2024-01-11 MED ORDER — LIDOCAINE 2% (20 MG/ML) 5 ML SYRINGE
INTRAMUSCULAR | Status: AC
  Filled 2024-01-11: qty 10

## 2024-01-11 SURGICAL SUPPLY — 46 items
BLADE SURG 15 STRL LF DISP TIS (BLADE) ×2 IMPLANT
BNDG COHESIVE 4X5 TAN STRL LF (GAUZE/BANDAGES/DRESSINGS) ×1 IMPLANT
BNDG COMPR ESMARK 4X3 LF (GAUZE/BANDAGES/DRESSINGS) ×1 IMPLANT
BNDG ELASTIC 4INX 5YD STR LF (GAUZE/BANDAGES/DRESSINGS) ×2 IMPLANT
BNDG GAUZE DERMACEA FLUFF 4 (GAUZE/BANDAGES/DRESSINGS) ×1 IMPLANT
CHLORAPREP W/TINT 26 (MISCELLANEOUS) ×1 IMPLANT
CORD BIPOLAR FORCEPS 12FT (ELECTRODE) ×1 IMPLANT
COVER BACK TABLE 60X90IN (DRAPES) ×1 IMPLANT
CUFF TOURN SGL QUICK 18X4 (TOURNIQUET CUFF) ×1 IMPLANT
DERMABOND ADVANCED .7 DNX12 (GAUZE/BANDAGES/DRESSINGS) IMPLANT
DRAPE HAND 77X146 (DRAPES) ×1 IMPLANT
DRAPE SURG 17X23 STRL (DRAPES) ×1 IMPLANT
GAUZE PAD ABD 8X10 STRL (GAUZE/BANDAGES/DRESSINGS) IMPLANT
GAUZE SPONGE 4X4 12PLY STRL (GAUZE/BANDAGES/DRESSINGS) ×1 IMPLANT
GAUZE STRETCH 2X75IN STRL (MISCELLANEOUS) IMPLANT
GAUZE XEROFORM 1X8 LF (GAUZE/BANDAGES/DRESSINGS) IMPLANT
GLOVE BIO SURGEON STRL SZ7.5 (GLOVE) ×2 IMPLANT
GLOVE BIOGEL PI IND STRL 7.5 (GLOVE) ×1 IMPLANT
GOWN STRL REUS W/ TWL LRG LVL3 (GOWN DISPOSABLE) ×1 IMPLANT
GOWN STRL SURGICAL XL XLNG (GOWN DISPOSABLE) ×2 IMPLANT
MANIFOLD NEPTUNE II (INSTRUMENTS) ×1 IMPLANT
NDL HYPO 25X5/8 SAFETYGLIDE (NEEDLE) IMPLANT
NEEDLE HYPO 25X5/8 SAFETYGLIDE (NEEDLE) IMPLANT
NS IRRIG 1000ML POUR BTL (IV SOLUTION) ×1 IMPLANT
PACK BASIN DAY SURGERY FS (CUSTOM PROCEDURE TRAY) ×1 IMPLANT
PAD CAST 3X4 CTTN HI CHSV (CAST SUPPLIES) ×1 IMPLANT
SHEET MEDIUM DRAPE 40X70 STRL (DRAPES) ×1 IMPLANT
SLING ARM FOAM STRAP LRG (SOFTGOODS) IMPLANT
SPIKE FLUID TRANSFER (MISCELLANEOUS) IMPLANT
SPLINT PLASTER CAST XFAST 4X15 (CAST SUPPLIES) ×1 IMPLANT
SPONGE SURGIFOAM ABS GEL 12-7 (HEMOSTASIS) IMPLANT
STOCKINETTE IMPERVIOUS 9X36 MD (GAUZE/BANDAGES/DRESSINGS) ×1 IMPLANT
STRIP CLOSURE SKIN 1/2X4 (GAUZE/BANDAGES/DRESSINGS) IMPLANT
SUCTION TUBE FRAZIER 10FR DISP (SUCTIONS) IMPLANT
SUT CHROMIC 3 0 SH 27 (SUTURE) IMPLANT
SUT ETHILON 4 0 PS 2 18 (SUTURE) IMPLANT
SUT MNCRL AB 3-0 PS2 27 (SUTURE) IMPLANT
SUT MNCRL AB 4-0 PS2 18 (SUTURE) IMPLANT
SUT PDS AB 4-0 RB1 27 (SUTURE) IMPLANT
SUT SILK 4 0 PS 2 (SUTURE) IMPLANT
SUT VIC AB 3-0 PS2 18XBRD (SUTURE) IMPLANT
SYR BULB EAR ULCER 3OZ GRN STR (SYRINGE) ×2 IMPLANT
SYR CONTROL 10ML LL (SYRINGE) IMPLANT
TOWEL GREEN STERILE FF (TOWEL DISPOSABLE) ×2 IMPLANT
TUBE CONNECTING 20X1/4 (TUBING) IMPLANT
UNDERPAD 30X36 HEAVY ABSORB (UNDERPADS AND DIAPERS) ×1 IMPLANT

## 2024-01-11 NOTE — H&P (Signed)
 @LOGODEPT @  Gabriel Myers - 38 y.o. male MRN [id number]  Date of birth: 21-May-1985   HAND SURGERY H&P UPDATE   HPI: Patient is a 38 y.o. male here today for right wrist first tensor compartment release for ongoing de Quervain's tenosynovitis.  No changes in his medical history since office visit.   Past Medical History:  Diagnosis Date   Asthma    GERD (gastroesophageal reflux disease)    Laryngeal cancer (HCC)    Urticaria    Past Surgical History:  Procedure Laterality Date   DIRECT LARYNGOSCOPY  01/30/2022   WRIST SURGERY Right    01/11/24   Social History   Socioeconomic History   Marital status: Single    Spouse name: Not on file   Number of children: Not on file   Years of education: Not on file   Highest education level: Not on file  Occupational History   Not on file  Tobacco Use   Smoking status: Former    Types: Cigarettes   Smokeless tobacco: Never  Vaping Use   Vaping status: Never Used  Substance and Sexual Activity   Alcohol use: No   Drug use: No   Sexual activity: Not on file  Other Topics Concern   Not on file  Social History Narrative   Not on file   Social Drivers of Health   Financial Resource Strain: Not on file  Food Insecurity: Medium Risk (11/22/2023)   Received from [facility]   Hunger Vital Sign    Within the past 12 months, you worried that your food would run out before you got money to buy more: Sometimes true    Within the past 12 months, the food you bought just didn't last and you didn't have money to get more. : Sometimes true  Transportation Needs: No Transportation Needs (11/22/2023)   Received from [facility]    In the past 12 months, has lack of reliable transportation kept you from medical appointments, meetings, work or from getting things needed for daily living? : No  Physical Activity: Not on file  Stress: Not on file  Social Connections: Unknown (10/04/2021)   [facility] from [facility]    Social Network    Social Network: Not on file   Family History  Problem Relation Age of Onset   Asthma Cousin    Diabetes Cousin    Urticaria Mother    Allergic rhinitis Sister    Sudden death Neg Hx    Hypertension Neg Hx    Hyperlipidemia Neg Hx    Heart attack Neg Hx    - negative except otherwise stated in the family history section No Known Allergies Prior to Admission medications   Medication Sig Start Date End Date Taking? Authorizing Provider  budesonide -formoterol  (SYMBICORT ) 80-4.5 MCG/ACT inhaler Inhale 2 puffs into the lungs 2 (two) times daily. 12/04/23  Yes Lorin Norris, MD  cetirizine  (ZYRTEC ) 10 MG tablet Take 1 tablet (10 mg total) by mouth daily. 12/04/23  Yes Lorin Norris, MD  fluticasone  (FLONASE ) 50 MCG/ACT nasal spray Place 1 spray into both nostrils daily. 12/04/23  Yes Lorin Norris, MD  Olopatadine  HCl (PATADAY ) 0.2 % SOLN Place 1 drop into both eyes 2 (two) times daily as needed. 12/04/23  Yes Lorin Norris, MD  diclofenac  Sodium (VOLTAREN ) 1 % GEL Apply 2 g topically every 6 (six) hours as needed. Patient not taking: Reported on 12/04/2023 09/24/21   Roemhildt, Lorin T, PA-C  EPINEPHrine  0.3 mg/0.3 mL  IJ SOAJ injection into the thigh. 09/11/21   [provider]   No results found. - Positive ROS: All other systems have been reviewed and were otherwise negative with the exception of those mentioned in the HPI and as above.  Right upper extremity  General: Patient is well appearing and in no distress. Cervical spine mobility is full in all directions:  Skin and Muscle: No skin changes are apparent to upper extremities.  Muscle bulk and contour normal, no signs of atrophy.     Range of Motion and Palpation Tests: Mobility is full about the elbows with flexion and extension.  Forearm supination and pronation are 85/85 bilaterally.  Wrist flexion/extension is 75/65 left side, wrist flexion/extension limited secondary to pain right side,  approximately 45/45.  Digital flexion and extension are full.    No cords or nodules are palpated.  No triggering is observed.   Finklestein test is positive right side, significant pain with associated swelling and tenderness.    Neurologic, Vascular, Motor: Sensation is intact to light touch in the median/radial/ulnar distributions.   Fingers pink and well perfused.  Capillary refill is brisk.      Assessment/Plan: OR today for right wrist first extensor compartment release. We again reviewed the risks of surgery which include bleeding, infection, damage to neurovascular structures, persistent symptoms, need for additional surgery.  Informed consent was signed.  All questions were answered.   Mikaiya [facility], Hand Surgery

## 2024-01-11 NOTE — Anesthesia Preprocedure Evaluation (Signed)
 Anesthesia Evaluation  Patient identified by MRN, date of birth, ID band Patient awake    Reviewed: Allergy  & Precautions, NPO status , Patient's Chart, lab work & pertinent test results  Airway Mallampati: II  TM Distance: >3 FB Neck ROM: Full    Dental  (+) Dental Advisory Given   Pulmonary asthma , former smoker   breath sounds clear to auscultation       Cardiovascular negative cardio ROS  Rhythm:Regular Rate:Normal     Neuro/Psych negative neurological ROS     GI/Hepatic Neg liver ROS,GERD  ,,  Endo/Other  negative endocrine ROS    Renal/GU negative Renal ROS     Musculoskeletal   Abdominal   Peds  Hematology negative hematology ROS (+)   Anesthesia Other Findings   Reproductive/Obstetrics                              Anesthesia Physical Anesthesia Plan  ASA: 2  Anesthesia Plan: MAC   Post-op Pain Management: Tylenol  PO (pre-op)* and Toradol  IV (intra-op)*   Induction:   PONV Risk Score and Plan: 1 and Propofol  infusion, Ondansetron , Treatment may vary due to age or medical condition and Midazolam   Airway Management Planned: Natural Airway and Simple Face Mask  Additional Equipment:   Intra-op Plan:   Post-operative Plan:   Informed Consent: I have reviewed the patients History and Physical, chart, labs and discussed the procedure including the risks, benefits and alternatives for the proposed anesthesia with the patient or authorized representative who has indicated his/her understanding and acceptance.       Plan Discussed with: CRNA  Anesthesia Plan Comments:         Anesthesia Quick Evaluation

## 2024-01-11 NOTE — Op Note (Signed)
 NAME: Gabriel Myers MEDICAL RECORD NO: [id number] DATE OF BIRTH: 1985/09/07 FACILITY: [facility] LOCATION: [facility] PHYSICIAN: Gracen Ringwald, MD   OPERATIVE REPORT   DATE OF PROCEDURE: 01/11/24    PREOPERATIVE DIAGNOSIS: Right wrist de Quervain's tenosynovitis   POSTOPERATIVE DIAGNOSIS: Right wrist de Quervain's tenosynovitis   PROCEDURE: Right wrist first extensor compartment release   SURGEON:  Gildardo Alderton, M.D.   ASSISTANT: Almeda Rummer, PA   ANESTHESIA:  Local with sedation   INTRAVENOUS FLUIDS:  Per anesthesia flow sheet.   ESTIMATED BLOOD LOSS:  Minimal.   COMPLICATIONS:  None.   SPECIMENS:  none   TOURNIQUET TIME:    Total Tourniquet Time Documented: Upper Arm (Right) - 7 minutes Total: Upper Arm (Right) - 7 minutes    DISPOSITION:  Stable to PACU.   INDICATIONS: 38 year old male with history of right wrist for extensor compartment tenosynovitis that was refractory to conservative care.  Patient was indicated for right wrist first extensor compartment release.  Risks and benefits of surgery were discussed including the risks of infection, bleeding, scarring, stiffness, nerve injury, vascular injury, tendon injury, need for subsequent operation, tendon instability, recurrence of symptoms.  He voiced understanding of these risks and elected to proceed.  OPERATIVE COURSE: Patient was seen and identified in the preoperative area and marked appropriately.  Surgical consent had been signed. Preoperative IV antibiotic prophylaxis was given. He was transferred to the operating room and placed in supine position with the Right upper extremity on an arm board.  Sedation was induced by the anesthesiologist.  Right upper extremity was prepped and draped in normal sterile orthopedic fashion.  A surgical pause was performed between the surgeons, anesthesia, and operating room staff and all were in agreement as to the patient, procedure, and site of procedure.   Tourniquet was placed and padded appropriately to the right upper arm.  10 cc of 1% lidocaine  with epinephrine  was utilized around the planned incisional site.  The arm was exsanguinated and the tourniquet was inflated to 250 mmHg.  A 3 cm incision was designed distal to the radial styloid at the glabrous/nonglabrous border of the dorsal radial wrist.  Crossing branches of the radial sensory nerve were identified and carefully protected.  The first extensor compartment was identified.  This was sharply incised along its dorsal most aspect.  The extensor pollicis brevis was identified and released.  The subsheath to the abductor pollicis longus was identified and sharply divided as well.   Following tendon sheath incision over the radial styloid, extensor tenosynovectomy was completed.  Inflammatory tissue was identified and sharply excised.  A tenolysis was performed for both the extensor pollicis brevis and abductor pollicis longus tendons as they were adherent to each other.  Smooth gliding to the tendon services was noted following extensor tenolysis.  There was no evidence of volar subluxation of the tendons with wrist circumduction.  The tourniquet was deflated and bipolar electrocautery was utilized for hemostasis.  Wound was closed in layers utilizing 3-0 Monocryl for the subcutaneous tissue and a running 4-0 Monocryl stitch for the skin surface.  Sterile dressings were applied followed by application of a thumb spica splint utilizing plaster.  Fingertips were pink with brisk capillary refill after deflation of tourniquet.  The operative drapes were broken down.  The patient was awoken from anesthesia safely and taken to PACU in stable condition.  I will see her back in the office in 2 weeks for postoperative followup.    The tourniquet was  deflated at 7 minutes.  Fingertips were pink with brisk capillary refill after deflation of tourniquet.  The operative drapes were broken down.  The patient was  awoken from anesthesia safely and taken to PACU in stable condition.   Post-operative plan: The patient will recover in the post-anesthesia care unit and then be discharged home.  The patient will be non weight bearing on the right upper extremity in a spica splint, sling when ambulatory.   I will see the patient back in the office in 2 weeks for postoperative followup.    Shaquira Moroz, MD Electronically signed, 01/11/24

## 2024-01-11 NOTE — Anesthesia Postprocedure Evaluation (Signed)
 Anesthesia Post Note  Patient: Gabriel Myers  Procedure(s) Performed: REPAIR, TENDON, EXTENSOR (Right: Wrist)     Patient location during evaluation: PACU Anesthesia Type: MAC Level of consciousness: awake and alert Pain management: pain level controlled Vital Signs Assessment: post-procedure vital signs reviewed and stable Respiratory status: spontaneous breathing, nonlabored ventilation, respiratory function stable and patient connected to nasal cannula oxygen Cardiovascular status: stable and blood pressure returned to baseline Postop Assessment: no apparent nausea or vomiting Anesthetic complications: no   No notable events documented.  Last Vitals:  Vitals:   01/11/24 1032 01/11/24 1046  BP: (!) 131/95 130/78  Pulse: 66 73  Resp: 18 16  Temp:  36.8 C  SpO2: 99% 99%    Last Pain:  Vitals:   01/11/24 1046  TempSrc:   PainSc: 0-No pain                 Epifanio Lamar BRAVO

## 2024-01-11 NOTE — Transfer of Care (Signed)
 Immediate Anesthesia Transfer of Care Note  Patient: Gabriel Myers  Procedure(s) Performed: REPAIR, TENDON, EXTENSOR (Right: Wrist)  Patient Location: PACU  Anesthesia Type:General  Level of Consciousness: drowsy and patient cooperative  Airway & Oxygen Therapy: Patient Spontanous Breathing and Patient connected to face mask oxygen  Post-op Assessment: Report given to RN and Post -op Vital signs reviewed and stable  Post vital signs: Reviewed and stable  Last Vitals:  Vitals Value Taken Time  BP 119/94 01/11/24 10:15  Temp 36.3 C 01/11/24 10:05  Pulse 69 01/11/24 10:25  Resp 14 01/11/24 10:25  SpO2 99 % 01/11/24 10:25  Vitals shown include unfiled device data.  Last Pain:  Vitals:   01/11/24 1005  TempSrc:   PainSc: 0-No pain      Patients Stated Pain Goal: 5 (01/11/24 0835)  Complications: No notable events documented.

## 2024-01-11 NOTE — Discharge Instructions (Addendum)
    Hand Surgery Postop Instructions   Dressings: Maintain postoperative dressing until orthopedic follow-up.  Keep operative site clean and dry until orthopedic follow-up.  Wound Care: Keep your hand elevated above the level of your heart.  Do not allow it to dangle by your side. Moving your fingers is advised to stimulate circulation but will depend on the site of your surgery.  If you have a splint applied, your doctor will advise you regarding movement.  Activity: Do not drive or operate machinery until clearance given from physician. No heavy lifting with operative extremity.  Diet:  Drink liquids today or eat a light diet.  You may resume a regular diet tomorrow.    General expectations: Take prescribed medication if given, transition to over-the-counter medication as quickly as possible. Fingers may become slightly swollen.  Call your doctor if any of the following occur: Severe pain not relieved by pain medication. Elevated temperature. Dressing soaked with blood. Inability to move fingers. White or bluish color to fingers.   Per [facility] clinic policy, our goal is ensure optimal postoperative pain control with a multimodal pain management strategy. For all [facility] patients, our goal is to wean post-operative narcotic medications by 6 weeks post-operatively. If this is not possible due to utilization of pain medication prior to surgery, your [facility] doctor will support your acute post-operative pain control for the first 6 weeks postoperatively, with a plan to transition you back to your primary pain team following that. Maralee will work to ensure a Therapist, occupational.  Jamol Ginyard Afton Alderton, M.D. Hand Surgery [facility]    Post Anesthesia Home Care Instructions  Activity: Get plenty of rest for the remainder of the day. A responsible individual must stay with you for 24 hours following the procedure.  For the next 24 hours, DO NOT: -Drive a  car -Advertising copywriter -Drink alcoholic beverages -Take any medication unless instructed by your physician -Make any legal decisions or sign important papers.  Meals: Start with liquid foods such as gelatin or soup. Progress to regular foods as tolerated. Avoid greasy, spicy, heavy foods. If nausea and/or vomiting occur, drink only clear liquids until the nausea and/or vomiting subsides. Call your physician if vomiting continues.  Special Instructions/Symptoms: Your throat may feel dry or sore from the anesthesia or the breathing tube placed in your throat during surgery. If this causes discomfort, gargle with warm salt water. The discomfort should disappear within 24 hours.  If you had a scopolamine patch placed behind your ear for the management of post- operative nausea and/or vomiting:  1. The medication in the patch is effective for 72 hours, after which it should be removed.  Wrap patch in a tissue and discard in the trash. Wash hands thoroughly with soap and water. 2. You may remove the patch earlier than 72 hours if you experience unpleasant side effects which may include dry mouth, dizziness or visual disturbances. 3. Avoid touching the patch. Wash your hands with soap and water after contact with the patch.     No tylenol  until after 240pm

## 2024-01-12 ENCOUNTER — Encounter (HOSPITAL_BASED_OUTPATIENT_CLINIC_OR_DEPARTMENT_OTHER): Payer: Self-pay | Admitting: Orthopedic Surgery

## 2024-01-22 NOTE — Therapy (Unsigned)
 OUTPATIENT OCCUPATIONAL THERAPY ORTHO EVALUATION  Patient Name: Gabriel Myers MRN: [id number] DOB:1986/03/26, 38 y.o., male Today's Date: 01/22/2024  PCP: none REFERRING PROVIDER: Dr. Arlinda  END OF SESSION:   Past Medical History:  Diagnosis Date   Asthma    GERD (gastroesophageal reflux disease)    Laryngeal cancer (HCC)    Urticaria    Past Surgical History:  Procedure Laterality Date   DIRECT LARYNGOSCOPY  01/30/2022   REPAIR EXTENSOR TENDON Right 01/11/2024   Procedure: REPAIR, TENDON, EXTENSOR;  Surgeon: Arlinda Buster, MD;  Location: [facility];  Service: Orthopedics;  Laterality: Right;  RIGHT WRIST FIRST EXTENSOR COMPARTMENT RELEASE   WRIST SURGERY Right    01/11/24   Patient Active Problem List   Diagnosis Date Noted   De Quervain's tenosynovitis, left 01/11/2024   Right leg pain 01/02/2014   Right knee injury 04/22/2013    ONSET DATE: 01/11/24 surgery  REFERRING DIAG: M65.4 (ICD-10-CM) - De Quervain's disease (tenosynovitis)   THERAPY DIAG:  No diagnosis found.  Rationale for Evaluation and Treatment: Rehabilitation  SUBJECTIVE:   SUBJECTIVE STATEMENT: *** Pt accompanied by: {accompnied:27141}  PERTINENT HISTORY: R wrist first extensor compartment release 01/11/24 due to DeQuervain's tenosynovitis  PRECAUTIONS: Other: splint at all times cleared for ROM     WEIGHT BEARING RESTRICTIONS: Yes ***  PAIN:  Are you having pain? {OPRCPAIN:27236}  FALLS: Has patient fallen in last 6 months? {fallsyesno:27318}  LIVING ENVIRONMENT: Lives with: {OPRC lives with:25569::lives with their family} Lives in: {Lives in:25570} Stairs: {opstairs:27293} Has following equipment at home: {Assistive devices:23999}  PLOF: {PLOF:24004}  PATIENT GOALS: ***  NEXT MD VISIT: ***  OBJECTIVE:  Note: Objective measures were completed at Evaluation unless otherwise noted.  HAND DOMINANCE: {MISC; OT HAND DOMINANCE:[phone]}  ADLs: {ADLs  OT:31716}  FUNCTIONAL OUTCOME MEASURES: {OTFUNCTIONALMEASURES:27238}  UPPER EXTREMITY ROM:     {AROM/PROM:27142} ROM Right eval Left eval  Shoulder flexion    Shoulder abduction    Shoulder adduction    Shoulder extension    Shoulder internal rotation    Shoulder external rotation    Elbow flexion    Elbow extension    Wrist flexion    Wrist extension    Wrist ulnar deviation    Wrist radial deviation    Wrist pronation    Wrist supination    (Blank rows = not tested)  {AROM/PROM:27142} ROM Right eval Left eval  Thumb MCP (0-60)    Thumb IP (0-80)    Thumb Radial abd/add (0-55)     Thumb Palmar abd/add (0-45)     Thumb Opposition to Small Finger     Index MCP (0-90)     Index PIP (0-100)     Index DIP (0-70)      Long MCP (0-90)      Long PIP (0-100)      Long DIP (0-70)      Ring MCP (0-90)      Ring PIP (0-100)      Ring DIP (0-70)      Little MCP (0-90)      Little PIP (0-100)      Little DIP (0-70)      (Blank rows = not tested)   UPPER EXTREMITY MMT:     MMT Right eval Left eval  Shoulder flexion    Shoulder abduction    Shoulder adduction    Shoulder extension    Shoulder internal rotation    Shoulder external rotation    Middle trapezius  Lower trapezius    Elbow flexion    Elbow extension    Wrist flexion    Wrist extension    Wrist ulnar deviation    Wrist radial deviation    Wrist pronation    Wrist supination    (Blank rows = not tested)  HAND FUNCTION: {handfunction:27230}  COORDINATION: {otcoordination:27237}  SENSATION: {sensation:27233}  EDEMA: ***  COGNITION: Overall cognitive status: {cognition:24006} Areas of impairment: {impairedcognition:27234}  OBSERVATIONS: ***   TREATMENT DATE: ***                                                                                                                            Modalities: {OPRCMODALITIES:31717}     PATIENT EDUCATION: Education details: *** Person  educated: {Person educated:25204} Education method: {Education Method:25205} Education comprehension: {Education Comprehension:25206}  HOME EXERCISE PROGRAM: ***  GOALS: Goals reviewed with patient? {yes/no:20286}  SHORT TERM GOALS: Target date: ***  *** Baseline: Goal status: INITIAL  2.  *** Baseline:  Goal status: INITIAL  3.  *** Baseline:  Goal status: INITIAL  4.  *** Baseline:  Goal status: INITIAL  5.  *** Baseline:  Goal status: INITIAL  6.  *** Baseline:  Goal status: INITIAL  LONG TERM GOALS: Target date: ***  *** Baseline:  Goal status: INITIAL  2.  *** Baseline:  Goal status: INITIAL  3.  *** Baseline:  Goal status: INITIAL  4.  *** Baseline:  Goal status: INITIAL  5.  *** Baseline:  Goal status: INITIAL  6.  *** Baseline:  Goal status: INITIAL  ASSESSMENT:  CLINICAL IMPRESSION: Patient is a *** y.o. *** who was seen today for occupational therapy evaluation for ***.   PERFORMANCE DEFICITS: in functional skills including {OT physical skills:25468}, cognitive skills including {OT cognitive skills:25469}, and psychosocial skills including {OT psychosocial skills:25470}.   IMPAIRMENTS: are limiting patient from {OT performance deficits:25471}.   COMORBIDITIES: {Comorbidities:25485} that affects occupational performance. Patient will benefit from skilled OT to address above impairments and improve overall function.  MODIFICATION OR ASSISTANCE TO COMPLETE EVALUATION: {OT modification:25474}  OT OCCUPATIONAL PROFILE AND HISTORY: {OT PROFILE AND HISTORY:25484}  CLINICAL DECISION MAKING: {OT CDM:25475}  REHAB POTENTIAL: {rehabpotential:25112}  EVALUATION COMPLEXITY: {Evaluation complexity:25115}      PLAN:  OT FREQUENCY: {rehab frequency:25116}  OT DURATION: {rehab duration:25117}  PLANNED INTERVENTIONS: {OT Interventions:25467}  RECOMMENDED OTHER SERVICES: ***  CONSULTED AND AGREED WITH PLAN OF CARE:  {ENR:74513}  PLAN FOR NEXT SESSION: ***   FREDERICO COLLAR, OT 01/22/2024, 3:48 PM

## 2024-01-23 ENCOUNTER — Ambulatory Visit: Admitting: Orthopedic Surgery

## 2024-01-23 DIAGNOSIS — M654 Radial styloid tenosynovitis [de Quervain]: Secondary | ICD-10-CM

## 2024-01-23 NOTE — Progress Notes (Signed)
   Gabriel Myers - 38 y.o. male MRN [id number]  Date of birth: [birth year]  Office Visit Note: Visit Date: 01/23/2024 PCP: System, Provider Not In Referred by: No ref. provider found  Subjective:  HPI: Gabriel Myers is a 38 y.o. male who presents today for follow up 2 weeks status post RIGHT WRIST FIRST EXTENSOR COMPARTMENT RELEASE.  He is doing well overall, pain is well-controlled.  Pertinent ROS were reviewed with the patient and found to be negative unless otherwise specified above in HPI.   Assessment & Plan: Visit Diagnoses:  1. De Quervain's disease (tenosynovitis)     Plan: Splint removed today, wound is well-healing.  He will be transition to a removable thumb spica brace today.  He will begin occupational therapy for range of motion protocol tomorrow.  Range of motion for 2 weeks for progression of strengthening at that time.  He can return to work, 5 pound lifting restriction with thumb spica brace at all times.  Follow-up with myself in 1 month  Follow-up: No follow-ups on file.   Meds & Orders: No orders of the defined types were placed in this encounter.  No orders of the defined types were placed in this encounter.    Procedures: No procedures performed       Objective:   Vital Signs: There were no vitals taken for this visit.  Ortho Exam Right wrist with well-healing radial sided incision, no erythema or drainage, gentle thumb circumduction without pain, thumb opposition to ring finger PIP, thumb abduction without significant pain, no evidence of tendon instability  Imaging: No results found.   Gabriel Myers Gabriel Myers, M.D. [facility], Hand Surgery

## 2024-01-24 ENCOUNTER — Encounter: Payer: Self-pay | Admitting: Occupational Therapy

## 2024-01-24 ENCOUNTER — Ambulatory Visit: Attending: Orthopedic Surgery | Admitting: Occupational Therapy

## 2024-01-24 ENCOUNTER — Other Ambulatory Visit: Payer: Self-pay

## 2024-01-24 DIAGNOSIS — M25631 Stiffness of right wrist, not elsewhere classified: Secondary | ICD-10-CM | POA: Diagnosis present

## 2024-01-24 DIAGNOSIS — M654 Radial styloid tenosynovitis [de Quervain]: Secondary | ICD-10-CM | POA: Diagnosis not present

## 2024-01-24 DIAGNOSIS — M25531 Pain in right wrist: Secondary | ICD-10-CM | POA: Diagnosis present

## 2024-01-24 DIAGNOSIS — M6281 Muscle weakness (generalized): Secondary | ICD-10-CM | POA: Diagnosis present

## 2024-01-24 DIAGNOSIS — M25641 Stiffness of right hand, not elsewhere classified: Secondary | ICD-10-CM | POA: Diagnosis present

## 2024-01-24 NOTE — Patient Instructions (Addendum)
 Your Splint This splint should initially be fitted by a healthcare practitioner.  The healthcare practitioner is responsible for providing wearing instructions and precautions to the patient, other healthcare practitioners and care provider involved in the patient's care.  This splint was custom made for you. Please read the following instructions to learn about wearing and caring for your splint.  Precautions Should your splint cause any of the following problems, remove the splint immediately and contact your therapist/physician. Swelling Severe Pain Pressure Areas Stiffness Numbness  Do not wear your splint while operating machinery unless it has been fabricated for that purpose.  When To Wear Your Splint Where your splint according to your therapist/physician instructions. All the time except for exercising and bathing, do not submerge hand in bathtub  Care and Cleaning of Your Splint Keep your splint away from open flames. Your splint will lose its shape in temperatures over 135 degrees Farenheit, ( in car windows, near radiators, ovens or in hot water).  Never make any adjustments to your splint, if the splint needs adjusting remove it and make an appointment to see your therapist. Your splint, including the cushion liner may be cleaned with soap and lukewarm water.  Do not immerse in hot water over 135 degrees Farenheit. Straps may be washed with soap and water, but do not moisten the self-adhesive portion.               Start gently for the first several days then you can progres the rnage of motion more.   Opposition (Active)   Touch tip of thumb to nail tip of each finger in turn, making an O shape. Repeat __10__ times. Do _3___ sessions per day.   AROM: Thumb Abduction / Adduction    Actively pull right thumb away from palm as far as possible.  Then bring thumb back to touch fingers. Try not to bend fingers toward thumb. Repeat _10-15___ times per set.   Do __3__ sessions per day.    MP Flexion (Active)   Bend thumb to touch base of little finger, keeping tip joint straight. Repeat __10-15__ times. Do _3___ sessions per day    Composite Extension (Active)   Bring thumb up and out in hitchhiker position.  Repeat __10-15__ times. Do _3___ sessions per day.   IP Flexion (Active Blocked)   Brace thumb below tip joint. Bend joint as far as possible. Repeat __10__ times. Do _4-6___ sessions per day. AROM: Wrist Extension   .  With _right___ palm down, bend wrist up. Repeat __15__ times per set.  Do __3__ sessions per day.     AROM: Wrist Radial / Ulnar Deviation    Gently bend left wrist from side to side as far as possible. Repeat ___10_ times per set. Do __1__ sets per session. Do __3__ sessions per day.  Copyright  VHI. All rights reserved.  .  Copyright  VHI. All rights reserved.

## 2024-01-28 ENCOUNTER — Encounter: Payer: Self-pay | Admitting: Orthopedic Surgery

## 2024-01-28 ENCOUNTER — Ambulatory Visit: Admitting: Occupational Therapy

## 2024-01-28 ENCOUNTER — Encounter: Payer: Self-pay | Admitting: Occupational Therapy

## 2024-01-28 DIAGNOSIS — M25531 Pain in right wrist: Secondary | ICD-10-CM

## 2024-01-28 DIAGNOSIS — M25641 Stiffness of right hand, not elsewhere classified: Secondary | ICD-10-CM | POA: Diagnosis not present

## 2024-01-28 DIAGNOSIS — M25631 Stiffness of right wrist, not elsewhere classified: Secondary | ICD-10-CM

## 2024-01-28 DIAGNOSIS — M6281 Muscle weakness (generalized): Secondary | ICD-10-CM

## 2024-01-28 NOTE — Therapy (Signed)
 OUTPATIENT OCCUPATIONAL THERAPY ORTHO Treatment  Patient Name: Gabriel Myers MRN: [id number] DOB:Sep 22, 1985, 38 y.o., male Today's Date: 01/28/2024  PCP: none REFERRING PROVIDER: Dr. Arlinda  END OF SESSION:  OT End of Session - 01/28/24 1323     Visit Number 2    Number of Visits 11    Date for OT Re-Evaluation 04/03/24    Authorization Type [employer]    Authorization - Visit Number 3    OT Start Time 1318    OT Stop Time 1358    OT Time Calculation (min) 40 min    Activity Tolerance Patient tolerated treatment well    Behavior During Therapy WFL for tasks assessed/performed          Past Medical History:  Diagnosis Date   Asthma    GERD (gastroesophageal reflux disease)    Laryngeal cancer (HCC)    Urticaria    Past Surgical History:  Procedure Laterality Date   DIRECT LARYNGOSCOPY  01/30/2022   REPAIR EXTENSOR TENDON Right 01/11/2024   Procedure: REPAIR, TENDON, EXTENSOR;  Surgeon: Arlinda Buster, MD;  Location: [facility];  Service: Orthopedics;  Laterality: Right;  RIGHT WRIST FIRST EXTENSOR COMPARTMENT RELEASE   WRIST SURGERY Right    01/11/24   Patient Active Problem List   Diagnosis Date Noted   De Quervain's tenosynovitis, left 01/11/2024   Right leg pain 01/02/2014   Right knee injury 04/22/2013    ONSET DATE: 01/11/24 surgery  REFERRING DIAG: M65.4 (ICD-10-CM) - Everitt Quervain's disease (tenosynovitis)   THERAPY DIAG:  Pain in right wrist  Stiffness of right hand, not elsewhere classified  Stiffness of right wrist, not elsewhere classified  Muscle weakness (generalized)  Rationale for Evaluation and Treatment: Rehabilitation  SUBJECTIVE:   SUBJECTIVE STATEMENT: Pt reports splint is fitting ok Pt accompanied by: self  PERTINENT HISTORY: R wrist first extensor compartment release 01/11/24 due to DeQuervain's tenosynovitis  PRECAUTIONS: Other: splint at all times(except bathing and exercising) cleared for ROM, per MD note  pt may beging strengthening 2 weeks from 01/23/24- will need to clarify.     WEIGHT BEARING RESTRICTIONS: Yes no lifting over 5 lbs, must wear splint at all times   PAIN:  Are you having pain? Yes: NPRS scale: 0-2/10 Pain location: wrist Pain description: aching  Aggravating factors: inactivity Relieving factors: movement  FALLS: Has patient fallen in last 6 months? No  LIVING ENVIRONMENT: Lives with: lives with their family and lives alone Lives in: House/apartment   PLOF: Independent  PATIENT GOALS: improve functional use of RUE  NEXT MD VISIT: 1 month, appt is not scheduled yet  OBJECTIVE:  Note: Objective measures were completed at Evaluation unless otherwise noted.  HAND DOMINANCE: Right  ADLs: Overall ADLs: currently suing primarily non dominant LUE  FUNCTIONAL OUTCOME MEASURES: Quick DASH:22.7% disability  UPPER EXTREMITY ROM:     Active ROM Right eval Left eval  Shoulder flexion    Shoulder abduction    Shoulder adduction    Shoulder extension    Shoulder internal rotation    Shoulder external rotation    Elbow flexion 35   Elbow extension 40   Wrist flexion    Wrist extension    Wrist ulnar deviation 20   Wrist radial deviation 25   Wrist pronation    Wrist supination    (Blank rows = not tested)  Active ROM Right eval Left eval  Thumb MCP (0-60) 30   Thumb IP (0-80) 30   Thumb Radial abd/add (0-55)  15-40    Thumb Palmar abd/add (0-45) 20-60    Thumb Opposition to Small Finger opposes 5th digit    Index MCP (0-90)     Index PIP (0-100)     Index DIP (0-70)      Long MCP (0-90)      Long PIP (0-100)      Long DIP (0-70)      Ring MCP (0-90)      Ring PIP (0-100)      Ring DIP (0-70)      Little MCP (0-90)      Little PIP (0-100)      Little DIP (0-70)      (Blank rows = not tested)    HAND FUNCTION:NT due to precautions.     SENSATION: Not tested  EDEMA: no significant edema, pt's incision at R radial wrist has  steristrps in place and is healing.  COGNITION: Overall cognitive status: Within functional limits for tasks assessed   OBSERVATIONS: Pleasant gentleman arrived wearing pre fab thumb spica. He is motivated to improve   TREATMENT DATE: 01/28/24- Pt arrived wearing splint. He reports splint is fitting well without issues. US  3.3 mhz, 0.8 w/cm2, 20% x 8 mins to radial wrist and thumb avoiding incision site as pt still has some steristrips in place. No adverse reactions, decreased pain. Reviewed A/ROM thumb and wrist exercises issued last visit. Ice pack to radial wrist and thumb x  6 mins no adverse reactions.  01/24/24- eval Pt was fitted with a custom fabricated volar thumb spica splint.    see pt education for tx.                                                                                                                              PATIENT EDUCATION: Education details:inital HEP review, splint wear, care and precautions Person educated: Patient Education method: Explanation, demonstration, v.c  Education comprehension: verbalized understanding, returned demonstration, and verbal cues required  HOME EXERCISE PROGRAM: 01/24/24- inital gentle A/ROM  GOALS: Goals reviewed with patient? Yes  SHORT TERM GOALS: Target date: 02/23/24  I with splint wear, care and precautions Baseline:dependent Goal statusmet 01/28/24  2.  I with inital HEP Baseline: dependent Goal status: met 01/28/24  3.  Pt increase wrist flexion and extension to 55* or greater for increased functional use. Baseline: see above Goal status:met for flexion 75, ongoing for extension 50 01/28/24  4.  Pt will demonstrate RUE thumb MP flexion of at least 55* for increased functional use. Baseline: see above Goal status ongoing 35 01/28/24  5.  Pt will demonstrate IP flexion of at least 60 for increased functional use. Baseline: see above Goal status: ongoing 01/28/24    LONG TERM GOALS: Target date:  04/03/24  I with updated HEP. Baseline: dependent Goal status: INITIAL  2.  Pt will increase grip strength to at least 25* for increased functional use. Baseline: NT due to precautions Goal status: INITIAL  3.  Pt will demonstrate improved RUE function as evidenced by improving  Quick DASH score to 18% or better. Baseline: 22.7% Goal status: INITIAL  4.  Pt will resume use of RUE as dominant hand at least 75% of the time or greater with pain no more than 3/10. Baseline: not consistently using RUE due to precautions. Goal status: INITIAL    ASSESSMENT:  CLINICAL IMPRESSION: Patient is progressing towards goals with improving A/ROM. Pt demonstrates some mild  pain, however it improved following US .PERFORMANCE DEFICITS: in functional skills including ADLs, IADLs, coordination, dexterity, ROM, strength, pain, flexibility, Fine motor control, Gross motor control, endurance, decreased knowledge of precautions, decreased knowledge of use of DME, and UE functional use, , and psychosocial skills including coping strategies, environmental adaptation, habits, interpersonal interactions, and routines and behaviors.   IMPAIRMENTS: are limiting patient from ADLs, IADLs, rest and sleep, education, work, play, leisure, and social participation.   COMORBIDITIES: has no other co-morbidities that affects occupational performance. Patient will benefit from skilled OT to address above impairments and improve overall function.  MODIFICATION OR ASSISTANCE TO COMPLETE EVALUATION: No modification of tasks or assist necessary to complete an evaluation.  OT OCCUPATIONAL PROFILE AND HISTORY: Detailed assessment: Review of records and additional review of physical, cognitive, psychosocial history related to current functional performance.  CLINICAL DECISION MAKING: LOW - limited treatment options, no task modification necessary  REHAB POTENTIAL: Good  EVALUATION COMPLEXITY: Low      PLAN:  OT  FREQUENCY: 1-2x/week  OT DURATION: 10 weeks  PLANNED INTERVENTIONS: 97168 OT Re-evaluation, 97535 self care/ADL training, 02889 therapeutic exercise, 97530 therapeutic activity, 97112 neuromuscular re-education, 97035 ultrasound, 97018 paraffin, 02989 moist heat, 97010 cryotherapy, 97034 contrast bath, 97014 electrical stimulation unattended, 97760 Orthotic Initial, 97763 Orthotic/Prosthetic subsequent, scar mobilization, passive range of motion, energy conservation, coping strategies training, patient/family education, and DME and/or AE instructions  RECOMMENDED OTHER SERVICES: n/a  CONSULTED AND AGREED WITH PLAN OF CARE: Patient  PLAN FOR NEXT SESSION:  check when pt can begin strengthening, splint check and modifications,progress HEP,  US ?   Nakeda Lebron, OT 01/28/2024, 1:46 PM

## 2024-02-04 ENCOUNTER — Ambulatory Visit: Admitting: Occupational Therapy

## 2024-02-04 DIAGNOSIS — M25531 Pain in right wrist: Secondary | ICD-10-CM

## 2024-02-04 DIAGNOSIS — M25641 Stiffness of right hand, not elsewhere classified: Secondary | ICD-10-CM | POA: Diagnosis not present

## 2024-02-04 DIAGNOSIS — M6281 Muscle weakness (generalized): Secondary | ICD-10-CM

## 2024-02-04 DIAGNOSIS — M25631 Stiffness of right wrist, not elsewhere classified: Secondary | ICD-10-CM

## 2024-02-04 NOTE — Therapy (Signed)
 OUTPATIENT OCCUPATIONAL THERAPY ORTHO Treatment  Patient Name: Gabriel Myers MRN: [id number] DOB:18-Apr-1986, 38 y.o., male Today's Date: 02/04/2024  PCP: none REFERRING PROVIDER: Dr. Arlinda  END OF SESSION:  OT End of Session - 02/04/24 1008     Visit Number 3    Number of Visits 11    Date for Recertification  04/03/24    Authorization Type [employer]    Authorization - Visit Number 3    OT Start Time 0803    OT Stop Time 0843    OT Time Calculation (min) 40 min    Activity Tolerance Patient tolerated treatment well    Behavior During Therapy WFL for tasks assessed/performed           Past Medical History:  Diagnosis Date   Asthma    GERD (gastroesophageal reflux disease)    Laryngeal cancer (HCC)    Urticaria    Past Surgical History:  Procedure Laterality Date   DIRECT LARYNGOSCOPY  01/30/2022   REPAIR EXTENSOR TENDON Right 01/11/2024   Procedure: REPAIR, TENDON, EXTENSOR;  Surgeon: Arlinda Buster, MD;  Location: [facility];  Service: Orthopedics;  Laterality: Right;  RIGHT WRIST FIRST EXTENSOR COMPARTMENT RELEASE   WRIST SURGERY Right    01/11/24   Patient Active Problem List   Diagnosis Date Noted   De Quervain's tenosynovitis, left 01/11/2024   Right leg pain 01/02/2014   Right knee injury 04/22/2013    ONSET DATE: 01/11/24 surgery  REFERRING DIAG: M65.4 (ICD-10-CM) - Everitt Quervain's disease (tenosynovitis)   THERAPY DIAG:  Pain in right wrist  Stiffness of right hand, not elsewhere classified  Stiffness of right wrist, not elsewhere classified  Muscle weakness (generalized)  Rationale for Evaluation and Treatment: Rehabilitation  SUBJECTIVE:   SUBJECTIVE STATEMENT: Pt reports splint continues to fit well Pt accompanied by: self  PERTINENT HISTORY: R wrist first extensor compartment release 01/11/24 due to DeQuervain's tenosynovitis  PRECAUTIONS: Other: splint at all times(except bathing and exercising) cleared for ROM, per  MD note pt may beging strengthening 2 weeks from 01/23/24- will need to clarify.     WEIGHT BEARING RESTRICTIONS: Yes no lifting over 5 lbs, must wear splint at all times   PAIN:  Are you having pain? Yes: NPRS scale: 0-1/10 Pain location: wrist Pain description: aching  Aggravating factors: inactivity Relieving factors: movement  FALLS: Has patient fallen in last 6 months? No  LIVING ENVIRONMENT: Lives with: lives with their family and lives alone Lives in: House/apartment   PLOF: Independent  PATIENT GOALS: improve functional use of RUE  NEXT MD VISIT: 1 month, appt is not scheduled yet  OBJECTIVE:  Note: Objective measures were completed at Evaluation unless otherwise noted.  HAND DOMINANCE: Right  ADLs: Overall ADLs: currently suing primarily non dominant LUE  FUNCTIONAL OUTCOME MEASURES: Quick DASH:22.7% disability  UPPER EXTREMITY ROM:     Active ROM Right eval Left eval  Shoulder flexion    Shoulder abduction    Shoulder adduction    Shoulder extension    Shoulder internal rotation    Shoulder external rotation    Elbow flexion 35   Elbow extension 40   Wrist flexion    Wrist extension    Wrist ulnar deviation 20   Wrist radial deviation 25   Wrist pronation    Wrist supination    (Blank rows = not tested)  Active ROM Right eval Left eval  Thumb MCP (0-60) 30   Thumb IP (0-80) 30   Thumb Radial  abd/add (0-55) 15-40    Thumb Palmar abd/add (0-45) 20-60    Thumb Opposition to Small Finger opposes 5th digit    Index MCP (0-90)     Index PIP (0-100)     Index DIP (0-70)      Long MCP (0-90)      Long PIP (0-100)      Long DIP (0-70)      Ring MCP (0-90)      Ring PIP (0-100)      Ring DIP (0-70)      Little MCP (0-90)      Little PIP (0-100)      Little DIP (0-70)      (Blank rows = not tested)    HAND FUNCTION:NT due to precautions.     SENSATION: Not tested  EDEMA: no significant edema, pt's incision at R radial wrist has  steristrps in place and is healing.  COGNITION: Overall cognitive status: Within functional limits for tasks assessed   OBSERVATIONS: Pleasant gentleman arrived wearing pre fab thumb spica. He is motivated to improve   TREATMENT DATE: 02/04/24 Pt arrived wearing splint. He reports splint is fitting well US  3.3 mhz, 0.8 w/cm2, 20% x 8 mins to radial wrist and thumb No adverse reactions, decreased pain. A/ROM thumb flexion/ extension, opposition, radial abduction/ adduction, IP flexion/ extension,  Pt was instructed in passive thumb composite and MP flexion as well as thumb flexion withulnar deviation then thumb flexion with wrist extension as well as prayer stretch. Pt returned demonstration.  01/28/24- Pt arrived wearing splint. He reports splint is fitting well without issues. US  3.3 mhz, 0.8 w/cm2, 20% x 8 mins to radial wrist and thumb avoiding incision site as pt still has some steristrips in place. No adverse reactions, decreased pain. Reviewed A/ROM thumb and wrist exercises issued last visit. Ice pack to radial wrist and thumb x  6 mins no adverse reactions.  01/24/24- eval Pt was fitted with a custom fabricated volar thumb spica splint.    see pt education for tx.                                                                                                                              PATIENT EDUCATION: Education details ipdated P/ROM HEP,   Person educated: Patient Education method: Explanation, demonstration, v.c , handout Education comprehension: verbalized understanding, returned demonstration, and verbal cues required  HOME EXERCISE PROGRAM: 01/24/24- inital gentle A/ROM  GOALS: Goals reviewed with patient? Yes  SHORT TERM GOALS: Target date: 02/23/24  I with splint wear, care and precautions Baseline:dependent Goal statusmet 01/28/24  2.  I with inital HEP Baseline: dependent Goal status: met 01/28/24  3.  Pt increase wrist flexion and extension to 55* or  greater for increased functional use. Baseline: see above Goal status:met for flexion 75, ongoing for extension 50 01/28/24  4.  Pt will demonstrate RUE thumb MP flexion of at least 55* for increased functional  use. Baseline: see above Goal status ongoing 45 02/04/24  5.  Pt will demonstrate IP flexion of at least 60 for increased functional use. Baseline: see above Goal status: met 65 02/04/24    LONG TERM GOALS: Target date: 04/03/24  I with updated HEP. Baseline: dependent Goal status: INITIAL  2.  Pt will increase grip strength to at least 25* for increased functional use. Baseline: NT due to precautions Goal status: INITIAL  3.  Pt will demonstrate improved RUE function as evidenced by improving  Quick DASH score to 18% or better. Baseline: 22.7% Goal status: INITIAL  4.  Pt will resume use of RUE as dominant hand at least 75% of the time or greater with pain no more than 3/10. Baseline: not consistently using RUE due to precautions. Goal status: INITIAL    ASSESSMENT:  CLINICAL IMPRESSION: Patient is progressing towards goals with improving A/ROM. Pt met STG #5 Pt demonstrates understanding of updated HEP for P/ROM.PERFORMANCE DEFICITS: in functional skills including ADLs, IADLs, coordination, dexterity, ROM, strength, pain, flexibility, Fine motor control, Gross motor control, endurance, decreased knowledge of precautions, decreased knowledge of use of DME, and UE functional use, , and psychosocial skills including coping strategies, environmental adaptation, habits, interpersonal interactions, and routines and behaviors.   IMPAIRMENTS: are limiting patient from ADLs, IADLs, rest and sleep, education, work, play, leisure, and social participation.   COMORBIDITIES: has no other co-morbidities that affects occupational performance. Patient will benefit from skilled OT to address above impairments and improve overall function.  MODIFICATION OR ASSISTANCE TO COMPLETE  EVALUATION: No modification of tasks or assist necessary to complete an evaluation.  OT OCCUPATIONAL PROFILE AND HISTORY: Detailed assessment: Review of records and additional review of physical, cognitive, psychosocial history related to current functional performance.  CLINICAL DECISION MAKING: LOW - limited treatment options, no task modification necessary  REHAB POTENTIAL: Good  EVALUATION COMPLEXITY: Low      PLAN:  OT FREQUENCY: 1-2x/week  OT DURATION: 10 weeks  PLANNED INTERVENTIONS: 97168 OT Re-evaluation, 97535 self care/ADL training, 02889 therapeutic exercise, 97530 therapeutic activity, 97112 neuromuscular re-education, 97035 ultrasound, 97018 paraffin, 02989 moist heat, 97010 cryotherapy, 97034 contrast bath, 97014 electrical stimulation unattended, 97760 Orthotic Initial, 97763 Orthotic/Prosthetic subsequent, scar mobilization, passive range of motion, energy conservation, coping strategies training, patient/family education, and DME and/or AE instructions  RECOMMENDED OTHER SERVICES: n/a  CONSULTED AND AGREED WITH PLAN OF CARE: Patient  PLAN FOR NEXT SESSION:  continue with P/ROM, progress to strenghtening when cleared by MD   Tura Roller, OT 02/04/2024, 10:08 AM

## 2024-02-04 NOTE — Patient Instructions (Signed)
 Composite Flexion (Passive)   Use other hand to bend both joints of thumb at the same time. Hold _10___ seconds. Repeat __5__ times. Do _3-4__ sessions per day.  Perfromwith wrist relaxed then perfrom with wrist in extension 10 reps 3x day   AROM: Wrist Radial / Ulnar Deviation Against Gravity    With thumb toward face, gently bend left wrist away from body  bend thumb to base of pinky finger, can hold in place with left hand for gentle stretch  Keep elbow bent and supported. Repeat _10___ times per set. Do __1__ sets per session. Do ___3_ sessions per day.  Copyright  VHI. All rights reserved.  Composite Extension (Passive Flexor Stretch)    Sitting with elbows on table and palms together, slowly lower wrists toward table until stretch is felt. Be sure to keep palms together throughout stretch. Hold ___5_ seconds. Relax. Repeat ___10_ times. Do __3__ sessions per day.  Copyright  VHI. All rights reserved.

## 2024-02-12 ENCOUNTER — Ambulatory Visit: Admitting: Occupational Therapy

## 2024-02-12 ENCOUNTER — Encounter: Payer: Self-pay | Admitting: Occupational Therapy

## 2024-02-12 DIAGNOSIS — M25631 Stiffness of right wrist, not elsewhere classified: Secondary | ICD-10-CM

## 2024-02-12 DIAGNOSIS — M25641 Stiffness of right hand, not elsewhere classified: Secondary | ICD-10-CM | POA: Diagnosis not present

## 2024-02-12 DIAGNOSIS — M6281 Muscle weakness (generalized): Secondary | ICD-10-CM

## 2024-02-12 DIAGNOSIS — M25531 Pain in right wrist: Secondary | ICD-10-CM

## 2024-02-12 NOTE — Patient Instructions (Signed)
 Grip Strengthening (Resistive Putty)    Squeeze putty using thumb and all fingers. Repeat ____ times. Do ____ sessions per day.  Copyright  VHI. All rights reserved. Finger / Thumb Activities: Extension    Roll putty into rope shape using all fingers held straight. Hitchhike with thumb up and out.  Copyright  VHI. All rights reserved.  Pinch: Palmar    Pinch putty with right thumb and each fingertip in turn. Keep wrist extended, do not flex  Repeat __10-20__ times. Do _1___ sessions per day. Activity: Peel fruit such as lemons or oranges.* Peel stickers off surfaces.  Copyright  VHI. All rights reserved.   Pinch: Lateral    Squeeze putty between right thumb and side of each finger in turn. Make small balls and squash into coin shapes. Repeat ___10-20_ times. Do ___1_ sessions per day. Keep wrist extended, do not flex or bend towards pinky side Activity: Hold dish of food.* Turn key in lock. Deal cards.  Copyright  VHI. All rights reserved.

## 2024-02-12 NOTE — Therapy (Signed)
 OUTPATIENT OCCUPATIONAL THERAPY ORTHO Treatment  Patient Name: Gabriel Myers MRN: [id number] DOB:02-Aug-1985, 38 y.o., male Today's Date: 02/12/2024  PCP: none REFERRING PROVIDER: Dr. Arlinda  END OF SESSION:  OT End of Session - 02/12/24 0816     Visit Number 4    Number of Visits 11    Authorization Type [employer]    Authorization - Visit Number 4    OT Start Time 0802    OT Stop Time 0842    OT Time Calculation (min) 40 min           Past Medical History:  Diagnosis Date   Asthma    GERD (gastroesophageal reflux disease)    Laryngeal cancer (HCC)    Urticaria    Past Surgical History:  Procedure Laterality Date   DIRECT LARYNGOSCOPY  01/30/2022   REPAIR EXTENSOR TENDON Right 01/11/2024   Procedure: REPAIR, TENDON, EXTENSOR;  Surgeon: Arlinda Buster, MD;  Location: [facility];  Service: Orthopedics;  Laterality: Right;  RIGHT WRIST FIRST EXTENSOR COMPARTMENT RELEASE   WRIST SURGERY Right    01/11/24   Patient Active Problem List   Diagnosis Date Noted   De Quervain's tenosynovitis, left 01/11/2024   Right leg pain 01/02/2014   Right knee injury 04/22/2013    ONSET DATE: 01/11/24 surgery  REFERRING DIAG: M65.4 (ICD-10-CM) - Everitt Quervain's disease (tenosynovitis)   THERAPY DIAG:  Pain in right wrist  Stiffness of right hand, not elsewhere classified  Stiffness of right wrist, not elsewhere classified  Muscle weakness (generalized)  Rationale for Evaluation and Treatment: Rehabilitation  SUBJECTIVE:   SUBJECTIVE STATEMENT: Pt see MD next week Pt accompanied by: self  PERTINENT HISTORY: R wrist first extensor compartment release 01/11/24 due to DeQuervain's tenosynovitis  PRECAUTIONS: Other: splint at all times(except bathing and exercising) cleared for ROM, per MD note pt may beging strengthening 2 weeks from 01/23/24- will need to clarify.     WEIGHT BEARING RESTRICTIONS: Yes no lifting over 5 lbs, must wear splint at all times   02/12/24- meassage via teams from Dr. hurley medical assistant clearing pt for strengthening, and to begin weaning from splint PAIN:  Are you having pain? Yes: NPRS scale: 0-1/10 Pain location: wrist Pain description: aching  Aggravating factors: inactivity Relieving factors: movement  FALLS: Has patient fallen in last 6 months? No  LIVING ENVIRONMENT: Lives with: lives with their family and lives alone Lives in: House/apartment   PLOF: Independent  PATIENT GOALS: improve functional use of RUE  NEXT MD VISIT: 1 month, appt is not scheduled yet  OBJECTIVE:  Note: Objective measures were completed at Evaluation unless otherwise noted.  HAND DOMINANCE: Right  ADLs: Overall ADLs: currently suing primarily non dominant LUE  FUNCTIONAL OUTCOME MEASURES: Quick DASH:22.7% disability  UPPER EXTREMITY ROM:     Active ROM Right eval Left eval  Shoulder flexion    Shoulder abduction    Shoulder adduction    Shoulder extension    Shoulder internal rotation    Shoulder external rotation    Elbow flexion 35   Elbow extension 40   Wrist flexion    Wrist extension    Wrist ulnar deviation 20   Wrist radial deviation 25   Wrist pronation    Wrist supination    (Blank rows = not tested)  Active ROM Right eval Left eval  Thumb MCP (0-60) 30   Thumb IP (0-80) 30   Thumb Radial abd/add (0-55) 15-40    Thumb Palmar abd/add (0-45) 20-60  Thumb Opposition to Small Finger opposes 5th digit    Index MCP (0-90)     Index PIP (0-100)     Index DIP (0-70)      Long MCP (0-90)      Long PIP (0-100)      Long DIP (0-70)      Ring MCP (0-90)      Ring PIP (0-100)      Ring DIP (0-70)      Little MCP (0-90)      Little PIP (0-100)      Little DIP (0-70)      (Blank rows = not tested)    HAND FUNCTION:NT due to precautions.     SENSATION: Not tested  EDEMA: no significant edema, pt's incision at R radial wrist has steristrps in place and is  healing.  COGNITION: Overall cognitive status: Within functional limits for tasks assessed   OBSERVATIONS: Pleasant gentleman arrived wearing pre fab thumb spica. He is motivated to improve   TREATMENT DATE:9/30/25Pt arrived wearing splint. US  3.3 mhz, 0.8 w/cm2, 20% x 8 mins to radial wrist and thumb No adverse reactions, decreased pain. A/ROM thumb flexion/ extension, opposition, radial abduction/ adduction, IP flexion/ extension,  Pt performed passive thumb composite and MP flexion as well as thumb flexion with ulnar deviation then thumb flexion with wrist extension as well as prayer stretch. Pt returned demonstration. Pt was instructed in gentle strengthening HEP with red putty. Forearm gym for increased ROM Grip strength 40#    02/04/24 Pt arrived wearing splint. He reports splint is fitting well US  3.3 mhz, 0.8 w/cm2, 20% x 8 mins to radial wrist and thumb No adverse reactions, decreased pain. A/ROM thumb flexion/ extension, opposition, radial abduction/ adduction, IP flexion/ extension,  Pt was instructed in passive thumb composite and MP flexion as well as thumb flexion with ulnar deviation then thumb flexion with wrist extension as well as prayer stretch. Pt returned demonstration.  01/28/24- Pt arrived wearing splint. He reports splint is fitting well without issues. US  3.3 mhz, 0.8 w/cm2, 20% x 8 mins to radial wrist and thumb avoiding incision site as pt still has some steristrips in place. No adverse reactions, decreased pain. Reviewed A/ROM thumb and wrist exercises issued last visit. Ice pack to radial wrist and thumb x  6 mins no adverse reactions.  01/24/24- eval Pt was fitted with a custom fabricated volar thumb spica splint.    see pt education for tx.                                                                                                                              PATIENT EDUCATION: Education details reviewed P/ROM/ stretches, instructed in red putty  HEP see pt instructions Person educated: Patient Education method: Explanation, demonstration, v.c , handout Education comprehension: verbalized understanding, returned demonstration, and verbal cues required  HOME EXERCISE PROGRAM: 01/24/24- inital gentle A/ROM  GOALS: Goals reviewed with patient? Yes  SHORT TERM GOALS: Target date: 02/23/24  I with splint wear, care and precautions Baseline:dependent Goal statusmet 01/28/24  2.  I with inital HEP Baseline: dependent Goal status: met 01/28/24  3.  Pt increase wrist flexion and extension to 55* or greater for increased functional use. Baseline: see above Goal status:met for flexion 75, ongoing for extension 50 01/28/24  4.  Pt will demonstrate RUE thumb MP flexion of at least 55* for increased functional use. Baseline: see above Goal status ongoing 45 02/04/24  5.  Pt will demonstrate IP flexion of at least 60 for increased functional use. Baseline: see above Goal status: met 65 02/04/24    LONG TERM GOALS: Target date: 04/03/24  I with updated HEP. Baseline: dependent Goal status: ongoing, putty issued 02/12/24  2.  Pt will increase grip strength to at least 25# for increased functional use. Baseline: NT due to precautions Goal status: met 40# 02/12/24  3.  Pt will demonstrate improved RUE function as evidenced by improving  Quick DASH score to 18% or better. Baseline: 22.7% Goal status: ongoing 02/12/24  4.  Pt will resume use of RUE as dominant hand at least 75% of the time or greater with pain no more than 3/10. Baseline: not consistently using RUE due to precautions. Goal status: ongoing 02/12/24    ASSESSMENT:  CLINICAL IMPRESSION: Patient is progressing towards goals with improving A/ROM and strength. Pt demonstrates understanding of strengthening HEP. PERFORMANCE DEFICITS: in functional skills including ADLs, IADLs, coordination, dexterity, ROM, strength, pain, flexibility, Fine motor control, Gross motor  control, endurance, decreased knowledge of precautions, decreased knowledge of use of DME, and UE functional use, , and psychosocial skills including coping strategies, environmental adaptation, habits, interpersonal interactions, and routines and behaviors.   IMPAIRMENTS: are limiting patient from ADLs, IADLs, rest and sleep, education, work, play, leisure, and social participation.   COMORBIDITIES: has no other co-morbidities that affects occupational performance. Patient will benefit from skilled OT to address above impairments and improve overall function.  MODIFICATION OR ASSISTANCE TO COMPLETE EVALUATION: No modification of tasks or assist necessary to complete an evaluation.  OT OCCUPATIONAL PROFILE AND HISTORY: Detailed assessment: Review of records and additional review of physical, cognitive, psychosocial history related to current functional performance.  CLINICAL DECISION MAKING: LOW - limited treatment options, no task modification necessary  REHAB POTENTIAL: Good  EVALUATION COMPLEXITY: Low      PLAN:  OT FREQUENCY: 1-2x/week  OT DURATION: 10 weeks  PLANNED INTERVENTIONS: 97168 OT Re-evaluation, 97535 self care/ADL training, 02889 therapeutic exercise, 97530 therapeutic activity, 97112 neuromuscular re-education, 97035 ultrasound, 97018 paraffin, 02989 moist heat, 97010 cryotherapy, 97034 contrast bath, 97014 electrical stimulation unattended, 97760 Orthotic Initial, 97763 Orthotic/Prosthetic subsequent, scar mobilization, passive range of motion, energy conservation, coping strategies training, patient/family education, and DME and/or AE instructions  RECOMMENDED OTHER SERVICES: n/a  CONSULTED AND AGREED WITH PLAN OF CARE: Patient  PLAN FOR NEXT SESSION:  continue with P/ROM,  gentle strengthening    Mattis Featherly, OT 02/12/2024, 8:17 AM

## 2024-02-18 ENCOUNTER — Encounter: Payer: Self-pay | Admitting: Occupational Therapy

## 2024-02-18 ENCOUNTER — Ambulatory Visit: Attending: Orthopedic Surgery | Admitting: Occupational Therapy

## 2024-02-18 DIAGNOSIS — M25631 Stiffness of right wrist, not elsewhere classified: Secondary | ICD-10-CM | POA: Insufficient documentation

## 2024-02-18 DIAGNOSIS — M6281 Muscle weakness (generalized): Secondary | ICD-10-CM | POA: Insufficient documentation

## 2024-02-18 DIAGNOSIS — M25641 Stiffness of right hand, not elsewhere classified: Secondary | ICD-10-CM | POA: Diagnosis present

## 2024-02-18 DIAGNOSIS — M25531 Pain in right wrist: Secondary | ICD-10-CM | POA: Insufficient documentation

## 2024-02-18 NOTE — Patient Instructions (Signed)
 Wrist Flexion: Resisted    With right palm up, _1-2___ pound weight in hand, or water bottle bend wrist up. Return slowly. Repeat with palm down Repeat __10__ times per set. Do __2__ sets per session. Do __1__ sessions per day.  Copyright  VHI. All rights reserved.

## 2024-02-18 NOTE — Therapy (Signed)
 OUTPATIENT OCCUPATIONAL THERAPY ORTHO Treatment  Patient Name: Gabriel Myers MRN: [id number] DOB:31-Dec-1985, 38 y.o., male Today's Date: 02/18/2024  PCP: none REFERRING PROVIDER: Dr. Arlinda  END OF SESSION:  OT End of Session - 02/18/24 0807     Visit Number 5    Number of Visits 11    Date for Recertification  04/03/24    Authorization Type [facility] Medicaid    Authorization - Visit Number 5    OT Start Time 0803    OT Stop Time 0843    OT Time Calculation (min) 40 min           Past Medical History:  Diagnosis Date   Asthma    GERD (gastroesophageal reflux disease)    Laryngeal cancer (HCC)    Urticaria    Past Surgical History:  Procedure Laterality Date   DIRECT LARYNGOSCOPY  01/30/2022   REPAIR EXTENSOR TENDON Right 01/11/2024   Procedure: REPAIR, TENDON, EXTENSOR;  Surgeon: Arlinda Buster, MD;  Location: [facility];  Service: Orthopedics;  Laterality: Right;  RIGHT WRIST FIRST EXTENSOR COMPARTMENT RELEASE   WRIST SURGERY Right    01/11/24   Patient Active Problem List   Diagnosis Date Noted   De Quervain's tenosynovitis, left 01/11/2024   Right leg pain 01/02/2014   Right knee injury 04/22/2013    ONSET DATE: 01/11/24 surgery  REFERRING DIAG: M65.4 (ICD-10-CM) - Everitt Quervain's disease (tenosynovitis)   THERAPY DIAG:  Pain in right wrist  Stiffness of right hand, not elsewhere classified  Stiffness of right wrist, not elsewhere classified  Muscle weakness (generalized)  Rationale for Evaluation and Treatment: Rehabilitation  SUBJECTIVE:   SUBJECTIVE STATEMENT: Pt denies pain Pt accompanied by: self  PERTINENT HISTORY: R wrist first extensor compartment release 01/11/24 due to DeQuervain's tenosynovitis  PRECAUTIONS: Other: splint at all times(except bathing and exercising) cleared for ROM, per MD note pt may beging strengthening 2 weeks from 01/23/24- will need to clarify.     WEIGHT BEARING RESTRICTIONS: Yes no lifting over 5  lbs, must wear splint at all times  02/12/24- meassage via teams from Dr. hurley medical assistant clearing pt for strengthening, and to begin weaning from splint PAIN:  Are you having pain? no  FALLS: Has patient fallen in last 6 months? No  LIVING ENVIRONMENT: Lives with: lives with their family and lives alone Lives in: House/apartment   PLOF: Independent  PATIENT GOALS: improve functional use of RUE  NEXT MD VISIT: 1 month, appt is not scheduled yet  OBJECTIVE:  Note: Objective measures were completed at Evaluation unless otherwise noted.  HAND DOMINANCE: Right  ADLs: Overall ADLs: currently suing primarily non dominant LUE  FUNCTIONAL OUTCOME MEASURES: Quick DASH:22.7% disability  UPPER EXTREMITY ROM:     Active ROM Right eval Left eval  Shoulder flexion    Shoulder abduction    Shoulder adduction    Shoulder extension    Shoulder internal rotation    Shoulder external rotation    Elbow flexion 35   Elbow extension 40   Wrist flexion    Wrist extension    Wrist ulnar deviation 20   Wrist radial deviation 25   Wrist pronation    Wrist supination    (Blank rows = not tested)  Active ROM Right eval Left eval  Thumb MCP (0-60) 30   Thumb IP (0-80) 30   Thumb Radial abd/add (0-55) 15-40    Thumb Palmar abd/add (0-45) 20-60    Thumb Opposition to Small Finger opposes 5th  digit    Index MCP (0-90)     Index PIP (0-100)     Index DIP (0-70)      Long MCP (0-90)      Long PIP (0-100)      Long DIP (0-70)      Ring MCP (0-90)      Ring PIP (0-100)      Ring DIP (0-70)      Little MCP (0-90)      Little PIP (0-100)      Little DIP (0-70)      (Blank rows = not tested)    HAND FUNCTION:NT due to precautions.     SENSATION: Not tested  EDEMA: no significant edema, pt's incision at R radial wrist has steristrps in place and is healing.  COGNITION: Overall cognitive status: Within functional limits for tasks assessed   OBSERVATIONS: Pleasant  gentleman arrived wearing pre fab thumb spica. He is motivated to improve   TREATMENT DATE:02/18/24- Paraffin x 8 mins to RUE for pain and stiffness, no adverse reactions A/ROM thumb flexion/ extension, opposition, radial abduction/ adduction, IP flexion/ extension,  Pt performed passive thumb composite and MP flexion as well as passive thumb flexion with ulnar deviation then thumb flexion with wrist extension as well as prayer stretch. red putty exercises for sustained grip, tip pinch and lateral pinch min v.c  wrist flexion/ extension with 2 lbs weight each direction, min v.c , therapist issued as HEP Pt reports he removed splint all day at home yesterday. pt plans to continue wearing splint at work until he sees MD again 02/12/24 Pt arrived wearing splint. US  3.3 mhz, 0.8 w/cm2, 20% x 8 mins to radial wrist and thumb No adverse reactions, decreased pain. A/ROM thumb flexion/ extension, opposition, radial abduction/ adduction, IP flexion/ extension,  Pt performed passive thumb composite and MP flexion as well as thumb flexion with ulnar deviation then thumb flexion with wrist extension as well as prayer stretch. Pt returned demonstration. Pt was instructed in gentle strengthening HEP with red putty. Forearm gym for increased ROM Grip strength 40#    02/04/24 Pt arrived wearing splint. He reports splint is fitting well US  3.3 mhz, 0.8 w/cm2, 20% x 8 mins to radial wrist and thumb No adverse reactions, decreased pain. A/ROM thumb flexion/ extension, opposition, radial abduction/ adduction, IP flexion/ extension,  Pt was instructed in passive thumb composite and MP flexion as well as thumb flexion with ulnar deviation then thumb flexion with wrist extension as well as prayer stretch. Pt returned demonstration.  01/28/24- Pt arrived wearing splint. He reports splint is fitting well without issues. US  3.3 mhz, 0.8 w/cm2, 20% x 8 mins to radial wrist and thumb avoiding incision site as pt still has  some steristrips in place. No adverse reactions, decreased pain. Reviewed A/ROM thumb and wrist exercises issued last visit. Ice pack to radial wrist and thumb x  6 mins no adverse reactions.  01/24/24- eval Pt was fitted with a custom fabricated volar thumb spica splint.    see pt education for tx.  PATIENT EDUCATION: Education details reviewed P/ROM/ stretches, reviewed red putty HEP  wrist flexion/ extension with 2lbs weight Person educated: Patient Education method: Explanation, demonstration, v.c , handout Education comprehension: verbalized understanding, returned demonstration, and verbal cues required  HOME EXERCISE PROGRAM: 01/24/24- inital gentle A/ROM  GOALS: Goals reviewed with patient? Yes  SHORT TERM GOALS: Target date: 02/23/24  I with splint wear, care and precautions Baseline:dependent Goal statusmet 01/28/24  2.  I with inital HEP Baseline: dependent Goal status: met 01/28/24  3.  Pt increase wrist flexion and extension to 55* or greater for increased functional use. Baseline: see above Goal status:met for flexion 75, ongoing for extension 50 01/28/24  4.  Pt will demonstrate RUE thumb MP flexion of at least 55* for increased functional use. Baseline: see above Goal status ongoing 45 02/18/24  5.  Pt will demonstrate IP flexion of at least 60 for increased functional use. Baseline: see above Goal status: met 65 02/04/24    LONG TERM GOALS: Target date: 04/03/24  I with updated HEP. Baseline: dependent Goal status: ongoing, pt demonstrates understanding of putty exercises 02/18/24  2.  Pt will increase grip strength to at least 25# for increased functional use. Baseline: NT due to precautions Goal status: met 40# 02/12/24  3.  Pt will demonstrate improved RUE function as evidenced by improving  Quick DASH score to 18% or  better. Baseline: 22.7% Goal status: ongoing 02/12/24  4.  Pt will resume use of RUE as dominant hand at least 75% of the time or greater with pain no more than 3/10. Baseline: not consistently using RUE due to precautions. Goal status: ongoing 02/12/24    ASSESSMENT:  CLINICAL IMPRESSION: Patient is progressing towards goals with improving A/ROM and strength. Pt continues to demonstrate limitations in thumb MP flexion.PERFORMANCE DEFICITS: in functional skills including ADLs, IADLs, coordination, dexterity, ROM, strength, pain, flexibility, Fine motor control, Gross motor control, endurance, decreased knowledge of precautions, decreased knowledge of use of DME, and UE functional use, , and psychosocial skills including coping strategies, environmental adaptation, habits, interpersonal interactions, and routines and behaviors.   IMPAIRMENTS: are limiting patient from ADLs, IADLs, rest and sleep, education, work, play, leisure, and social participation.   COMORBIDITIES: has no other co-morbidities that affects occupational performance. Patient will benefit from skilled OT to address above impairments and improve overall function.  MODIFICATION OR ASSISTANCE TO COMPLETE EVALUATION: No modification of tasks or assist necessary to complete an evaluation.  OT OCCUPATIONAL PROFILE AND HISTORY: Detailed assessment: Review of records and additional review of physical, cognitive, psychosocial history related to current functional performance.  CLINICAL DECISION MAKING: LOW - limited treatment options, no task modification necessary  REHAB POTENTIAL: Good  EVALUATION COMPLEXITY: Low      PLAN:  OT FREQUENCY: 1-2x/week  OT DURATION: 10 weeks  PLANNED INTERVENTIONS: 97168 OT Re-evaluation, 97535 self care/ADL training, 02889 therapeutic exercise, 97530 therapeutic activity, 97112 neuromuscular re-education, 97035 ultrasound, 97018 paraffin, 02989 moist heat, 97010 cryotherapy, 97034 contrast  bath, 97014 electrical stimulation unattended, 97760 Orthotic Initial, 97763 Orthotic/Prosthetic subsequent, scar mobilization, passive range of motion, energy conservation, coping strategies training, patient/family education, and DME and/or AE instructions  RECOMMENDED OTHER SERVICES: n/a  CONSULTED AND AGREED WITH PLAN OF CARE: Patient  PLAN FOR NEXT SESSION:  gentle strengthening , P/ROM, ? add 1 more visit   Ileta Ofarrell, OT 02/18/2024, 8:09 AM

## 2024-02-27 ENCOUNTER — Ambulatory Visit: Payer: Self-pay | Admitting: Occupational Therapy

## 2024-02-27 ENCOUNTER — Encounter: Payer: Self-pay | Admitting: Occupational Therapy

## 2024-02-27 DIAGNOSIS — M25531 Pain in right wrist: Secondary | ICD-10-CM

## 2024-02-27 DIAGNOSIS — M6281 Muscle weakness (generalized): Secondary | ICD-10-CM

## 2024-02-27 DIAGNOSIS — M25631 Stiffness of right wrist, not elsewhere classified: Secondary | ICD-10-CM

## 2024-02-27 DIAGNOSIS — M25641 Stiffness of right hand, not elsewhere classified: Secondary | ICD-10-CM

## 2024-02-27 NOTE — Therapy (Signed)
 OUTPATIENT OCCUPATIONAL THERAPY ORTHO Treatment  Patient Name: Gabriel Myers MRN: [id number] DOB:1986/05/06, 38 y.o., male Today's Date: 02/27/2024  PCP: none REFERRING PROVIDER: Dr. Arlinda  END OF SESSION:  OT End of Session - 02/27/24 0757     Visit Number 6    Number of Visits 11    Date for Recertification  04/03/24    Authorization Type [employer] Medicaid    Authorization - Visit Number 6    OT Start Time 0803    OT Stop Time 0841    OT Time Calculation (min) 38 min    Activity Tolerance Patient tolerated treatment well    Behavior During Therapy WFL for tasks assessed/performed           Past Medical History:  Diagnosis Date   Asthma    GERD (gastroesophageal reflux disease)    Laryngeal cancer (HCC)    Urticaria    Past Surgical History:  Procedure Laterality Date   DIRECT LARYNGOSCOPY  01/30/2022   REPAIR EXTENSOR TENDON Right 01/11/2024   Procedure: REPAIR, TENDON, EXTENSOR;  Surgeon: Arlinda Buster, MD;  Location: [facility];  Service: Orthopedics;  Laterality: Right;  RIGHT WRIST FIRST EXTENSOR COMPARTMENT RELEASE   WRIST SURGERY Right    01/11/24   Patient Active Problem List   Diagnosis Date Noted   De Quervain's tenosynovitis, left 01/11/2024   Right leg pain 01/02/2014   Right knee injury 04/22/2013    ONSET DATE: 01/11/24 surgery  REFERRING DIAG: M65.4 (ICD-10-CM) - Everitt Quervain's disease (tenosynovitis)   THERAPY DIAG:  Pain in right wrist  Stiffness of right hand, not elsewhere classified  Stiffness of right wrist, not elsewhere classified  Muscle weakness (generalized)  Rationale for Evaluation and Treatment: Rehabilitation  SUBJECTIVE:   SUBJECTIVE STATEMENT: Pt reports pain over the weekend but no pain today Pt accompanied by: self  PERTINENT HISTORY: R wrist first extensor compartment release 01/11/24 due to DeQuervain's tenosynovitis  PRECAUTIONS: Other: splint at all times(except bathing and exercising) cleared  for ROM, per MD note pt may beging strengthening 2 weeks from 01/23/24- will need to clarify.     WEIGHT BEARING RESTRICTIONS: Yes no lifting over 5 lbs, must wear splint at all times  02/12/24- meassage via teams from Dr. hurley medical assistant clearing pt for strengthening, and to begin weaning from splint PAIN:  Are you having pain? no  FALLS: Has patient fallen in last 6 months? No  LIVING ENVIRONMENT: Lives with: lives with their family and lives alone Lives in: House/apartment   PLOF: Independent  PATIENT GOALS: improve functional use of RUE  NEXT MD VISIT: 1 month, appt is not scheduled yet  OBJECTIVE:  Note: Objective measures were completed at Evaluation unless otherwise noted.  HAND DOMINANCE: Right  ADLs: Overall ADLs: currently suing primarily non dominant LUE  FUNCTIONAL OUTCOME MEASURES: Quick DASH:22.7% disability  UPPER EXTREMITY ROM:     Active ROM Right eval Left eval  Shoulder flexion    Shoulder abduction    Shoulder adduction    Shoulder extension    Shoulder internal rotation    Shoulder external rotation    Elbow flexion 35   Elbow extension 40   Wrist flexion    Wrist extension    Wrist ulnar deviation 20   Wrist radial deviation 25   Wrist pronation    Wrist supination    (Blank rows = not tested)  Active ROM Right eval Left eval  Thumb MCP (0-60) 30   Thumb IP (0-80)  30   Thumb Radial abd/add (0-55) 15-40    Thumb Palmar abd/add (0-45) 20-60    Thumb Opposition to Small Finger opposes 5th digit    Index MCP (0-90)     Index PIP (0-100)     Index DIP (0-70)      Long MCP (0-90)      Long PIP (0-100)      Long DIP (0-70)      Ring MCP (0-90)      Ring PIP (0-100)      Ring DIP (0-70)      Little MCP (0-90)      Little PIP (0-100)      Little DIP (0-70)      (Blank rows = not tested)    HAND FUNCTION:NT due to precautions.     SENSATION: Not tested  EDEMA: no significant edema, pt's incision at R radial wrist  has steristrps in place and is healing.  COGNITION: Overall cognitive status: Within functional limits for tasks assessed   OBSERVATIONS: Pleasant gentleman arrived wearing pre fab thumb spica. He is motivated to improve   TREATMENT DATE: 02/27/24 US  3.3 mhz, 0.8 w/cm2, 20% x 8 mins to radial wrist and thumb No adverse reactions, decreased pain. A/ROM thumb flexion/ extension, opposition,  Pt performed passive thumb composite and MP flexion as well as thumb flexion with ulnar deviation then thumb flexion with wrist extension as well as prayer stretch. Pt returned demonstration.Scar massage to pt incision site. Gentle strengthening with red putty composite finger flexion, lateral pinch and tip pinch, min v.c Therapist checked progress towards goals. Pt sees MD next week. He does not have anymore visits scheduled a his time and he feels ready for d/c. Pt to discuss progress with MD and he will schedule additional visits if needed. Thumb MP flexion 45*  02/18/24- Paraffin x 8 mins to RUE for pain and stiffness, no adverse reactions A/ROM thumb flexion/ extension, opposition, radial abduction/ adduction, IP flexion/ extension,  Pt performed passive thumb composite and MP flexion as well as passive thumb flexion with ulnar deviation then thumb flexion with wrist extension as well as prayer stretch. red putty exercises for sustained grip, tip pinch and lateral pinch min v.c  wrist flexion/ extension with 2 lbs weight each direction, min v.c , therapist issued as HEP Pt reports he removed splint all day at home yesterday. pt plans to continue wearing splint at work until he sees MD again 02/12/24 Pt arrived wearing splint. US  3.3 mhz, 0.8 w/cm2, 20% x 8 mins to radial wrist and thumb No adverse reactions, decreased pain. A/ROM thumb flexion/ extension, opposition, radial abduction/ adduction, IP flexion/ extension,  Pt performed passive thumb composite and MP flexion as well as thumb flexion with  ulnar deviation then thumb flexion with wrist extension as well as prayer stretch. Pt returned demonstration. Pt was instructed in gentle strengthening HEP with red putty. Forearm gym for increased ROM Grip strength 40#    02/04/24 Pt arrived wearing splint. He reports splint is fitting well US  3.3 mhz, 0.8 w/cm2, 20% x 8 mins to radial wrist and thumb No adverse reactions, decreased pain. A/ROM thumb flexion/ extension, opposition, radial abduction/ adduction, IP flexion/ extension,  Pt was instructed in passive thumb composite and MP flexion as well as thumb flexion with ulnar deviation then thumb flexion with wrist extension as well as prayer stretch. Pt returned demonstration.  01/28/24- Pt arrived wearing splint. He reports splint is fitting well without issues. US  3.3  mhz, 0.8 w/cm2, 20% x 8 mins to radial wrist and thumb avoiding incision site as pt still has some steristrips in place. No adverse reactions, decreased pain. Reviewed A/ROM thumb and wrist exercises issued last visit. Ice pack to radial wrist and thumb x  6 mins no adverse reactions.  01/24/24- eval Pt was fitted with a custom fabricated volar thumb spica splint.    see pt education for tx.                                                                                                                              PATIENT EDUCATION: Education details reviewed P/ROM/ stretches, reviewed red putty HEP  wrist flexion/ extension with 2lbs weight Person educated: Patient Education method: Explanation, demonstration, v.c , handout Education comprehension: verbalized understanding, returned demonstration, and verbal cues required  HOME EXERCISE PROGRAM: 01/24/24- inital gentle A/ROM  GOALS: Goals reviewed with patient? Yes  SHORT TERM GOALS: Target date: 02/23/24  I with splint wear, care and precautions Baseline:dependent Goal statusmet 01/28/24  2.  I with inital HEP Baseline: dependent Goal status: met  01/28/24  3.  Pt increase wrist flexion and extension to 55* or greater for increased functional use. Baseline: see above Goal status:met for flexion 75, ongoing for extension 60 met  4.  Pt will demonstrate RUE thumb MP flexion of at least 55* for increased functional use. Baseline: see above Goal status ongoing 45 02/27/24  5.  Pt will demonstrate IP flexion of at least 60 for increased functional use. Baseline: see above Goal status: met 65 02/04/24    LONG TERM GOALS: Target date: 04/03/24  I with updated HEP. Baseline: dependent Goal status: met 02/27/24  2.  Pt will increase grip strength to at least 25# for increased functional use. Baseline: NT due to precautions Goal status: met 40# 02/12/24  3.  Pt will demonstrate improved RUE function as evidenced by improving  Quick DASH score to 18% or better. Baseline: 22.7% Goal status: met 5%  4.  Pt will resume use of RUE as dominant hand at least 75% of the time or greater with pain no more than 3/10. Baseline: not consistently using RUE due to precautions. Goal status: met, 02/27/24    ASSESSMENT:  CLINICAL IMPRESSION: Patient has made excellent overall progress. He has achieved all long term goals. He has not fully achieved STG#4 for MP flexion however he reports this is not impeding his perfromance of ADLS/IADLs. PT reports he feels ready for d/c. Pt sees MD next week. Therapist will not fully d/c chart until after pt sees MD in case he wants to schedule 1 additional visit. If pt does not call to schedule additional visits OT will d/c.    PERFORMANCE DEFICITS: in functional skills including ADLs, IADLs, coordination, dexterity, ROM, strength, pain, flexibility, Fine motor control, Gross motor control, endurance, decreased knowledge of precautions, decreased knowledge of use of DME, and UE functional use, , and psychosocial  skills including coping strategies, environmental adaptation, habits, interpersonal interactions, and  routines and behaviors.   IMPAIRMENTS: are limiting patient from ADLs, IADLs, rest and sleep, education, work, play, leisure, and social participation.   COMORBIDITIES: has no other co-morbidities that affects occupational performance. Patient will benefit from skilled OT to address above impairments and improve overall function.  MODIFICATION OR ASSISTANCE TO COMPLETE EVALUATION: No modification of tasks or assist necessary to complete an evaluation.  OT OCCUPATIONAL PROFILE AND HISTORY: Detailed assessment: Review of records and additional review of physical, cognitive, psychosocial history related to current functional performance.  CLINICAL DECISION MAKING: LOW - limited treatment options, no task modification necessary  REHAB POTENTIAL: Good  EVALUATION COMPLEXITY: Low      PLAN:  OT FREQUENCY: 1-2x/week  OT DURATION: 10 weeks  PLANNED INTERVENTIONS: 97168 OT Re-evaluation, 97535 self care/ADL training, 02889 therapeutic exercise, 97530 therapeutic activity, 97112 neuromuscular re-education, 97035 ultrasound, 97018 paraffin, 02989 moist heat, 97010 cryotherapy, 97034 contrast bath, 97014 electrical stimulation unattended, 97760 Orthotic Initial, 97763 Orthotic/Prosthetic subsequent, scar mobilization, passive range of motion, energy conservation, coping strategies training, patient/family education, and DME and/or AE instructions  RECOMMENDED OTHER SERVICES: n/a  CONSULTED AND AGREED WITH PLAN OF CARE: Patient  PLAN FOR NEXT SESSION:d/c vs. add one more visit after pt sees MD   Lenis Nettleton, OT 02/27/2024, 12:27 PM

## 2024-03-05 ENCOUNTER — Ambulatory Visit: Admitting: Orthopedic Surgery

## 2024-03-05 ENCOUNTER — Telehealth: Payer: Self-pay | Admitting: Orthopedic Surgery

## 2024-03-05 DIAGNOSIS — M654 Radial styloid tenosynovitis [de Quervain]: Secondary | ICD-10-CM

## 2024-03-05 NOTE — Telephone Encounter (Signed)
 Patient  called and said can you send a letter saying that he is clear to go to work with  no restrictions. CB#[phone] If you could send to email [email or web address]

## 2024-03-05 NOTE — Progress Notes (Signed)
   Gabriel Myers - 38 y.o. male MRN [id number]  Date of birth: 08-05-1985  Office Visit Note: Visit Date: 03/05/2024 PCP: System, Provider Not In Referred by: No ref. provider found  Subjective:  HPI: Gabriel Myers is a 38 y.o. male who presents today for follow up 8 weeks status post  RIGHT WRIST FIRST EXTENSOR COMPARTMENT RELEASE .  Doing well overall, occasionally utilizing the wrist brace as needed for more strenuous activities.  He is overall pleased with his progress, would like to return to work in full capacity  Pertinent ROS were reviewed with the patient and found to be negative unless otherwise specified above in HPI.   Assessment & Plan: Visit Diagnoses: No diagnosis found.  Plan: He is doing well overall and can resume all activities without restriction at this point.  Work note was provided today.  Follow-up as needed.  Follow-up: No follow-ups on file.   Meds & Orders: No orders of the defined types were placed in this encounter.  No orders of the defined types were placed in this encounter.    Procedures: No procedures performed       Objective:   Vital Signs: There were no vitals taken for this visit.  Ortho Exam Right wrist: - Well-healed incision at the glabrous/nonglabrous juncture over the radial wrist - Thumb opposition to the small finger DPC - Thumb circumduction without significant pain or crepitus - Hand is warm well-perfused, sensation intact in all distributions including DRSN   Imaging: No results found.   Gabriel Myers Gabriel Myers, M.D. [facility], Hand Surgery

## 2024-03-17 ENCOUNTER — Encounter: Payer: Self-pay | Admitting: Radiology

## 2024-04-16 ENCOUNTER — Telehealth: Payer: Self-pay | Admitting: Internal Medicine

## 2024-04-16 NOTE — Telephone Encounter (Signed)
 Pt called and stated he needs a letter stating why he needs a two bed room apartment and she can say he has asthma and requested a call back.

## 2024-04-17 NOTE — Telephone Encounter (Signed)
 Spoke with Penne and I scheduled him a follow up appointment for next week.

## 2024-04-17 NOTE — Telephone Encounter (Signed)
 Pt was last seen in July. Why would he need a 2 bedroom apartment because he has asthma?

## 2024-04-22 ENCOUNTER — Encounter: Payer: Self-pay | Admitting: Internal Medicine

## 2024-04-22 ENCOUNTER — Other Ambulatory Visit: Payer: Self-pay

## 2024-04-22 ENCOUNTER — Ambulatory Visit: Admitting: Internal Medicine

## 2024-04-22 VITALS — BP 118/78 | HR 80 | Temp 98.9°F | Resp 18 | Ht 67.0 in | Wt 154.8 lb

## 2024-04-22 DIAGNOSIS — J3089 Other allergic rhinitis: Secondary | ICD-10-CM | POA: Diagnosis not present

## 2024-04-22 DIAGNOSIS — H1010 Acute atopic conjunctivitis, unspecified eye: Secondary | ICD-10-CM

## 2024-04-22 DIAGNOSIS — Z8521 Personal history of malignant neoplasm of larynx: Secondary | ICD-10-CM

## 2024-04-22 DIAGNOSIS — J45991 Cough variant asthma: Secondary | ICD-10-CM

## 2024-04-22 DIAGNOSIS — K219 Gastro-esophageal reflux disease without esophagitis: Secondary | ICD-10-CM | POA: Diagnosis not present

## 2024-04-22 DIAGNOSIS — L501 Idiopathic urticaria: Secondary | ICD-10-CM

## 2024-04-22 MED ORDER — ALBUTEROL SULFATE HFA 108 (90 BASE) MCG/ACT IN AERS: 2.0000 | INHALATION_SPRAY | Freq: Four times a day (QID) | RESPIRATORY_TRACT | 2 refills | Status: AC | PRN

## 2024-04-22 MED ORDER — METHYLPREDNISOLONE ACETATE 40 MG/ML IJ SUSP
40.0000 mg | Freq: Once | INTRAMUSCULAR | Status: AC
  Administered 2024-04-22: 40 mg via INTRAMUSCULAR

## 2024-04-22 NOTE — Progress Notes (Signed)
 FOLLOW UP Date of Service/Encounter:  04/22/24  Subjective:  Gabriel Myers (DOB: 1985-12-14) is a 38 y.o. male who [facility] in re-evaluation of the following: cough variant asthma, rhinitis, urticaria, GERD  History obtained from: chart review and patient.  For Review, LV was on 12/10/23  with Dr. Lorin seen for skin testing . See below for summary of history and diagnostics.   ----------------------------------------------------- Pertinent History/Diagnostics:  Cough varian asthma : Symptoms include cough, wheezing, and shortness of breath, exacerbated by weather changes, exercise and illness  - Previously on breztri PRN, stepped down to symbicort  80mcg BID (12/04/23) - normal spirometry (11/2023): ratio 84, 82%, 2.72L FEV1 (pre),  Chronic Rhinitis:  Symptoms include stuffy nose and itchy, watery eyes, present year-round. Previous allergy  testing by [facility] positive to grass. -Allergy  test ( 12/10/23): Negative to all environmentals and select food - Rx: Flonase , pataday   Urticaria:  Onset 2019, Lasts 5-15 minutes, occurring every few months  - Rx: zyrtec  PRN  - labs (2019) IgE 4, tryptase 8.4, AEC 100, CU index negative, CRP negative  Other: H/O laryngeal cancer w/ persistent hoarseness ; GERD   --------------------------------------------------- Today presents for follow-up. Discussed the use of AI scribe software for clinical note transcription with the patient, who gave verbal consent to proceed.  History of Present Illness Gabriel Myers is a 38 year old male with asthma who presents with increased cough and breathing difficulties.  Asthma symptoms and control - Increased coughing over the past week, main symptom related to asthma - Breathing difficulties present - No nocturnal symptoms or waking up at night with symptoms - Uses Symbicort , two puffs twice daily; missed one dose yesterday due to moving - No current rescue  inhaler - No recent use of prednisone  or steroid injection for asthma; last steroid use was over a year ago for wrist surgery  Ocular and nasal allergic symptoms - Itchy eyes, uses pataday  with good effect - Uses Flonase  for nasal symptoms  Urticaria  - No hives present since last visit   Gastroesophageal reflux symptoms - Reflux is stable - Not currently on treatment, monitoring only  Environmental and allergen exposure - Recently moved out of an apartment with black mold - Currently living with uncle in a clean environment - Denies smoke or allergen exposure in current residence - awaiting placement from [facility] for 2 bedroom apartment. Needs a letter of support.  Waitlist for one bedroom was 700 people, and [facility] advised getting on 2 bedroom wait list which only has 7   Infectious exposure - No recent sick contacts     All medications reviewed by clinical staff and updated in chart. No new pertinent medical or surgical history except as noted in HPI.  ROS: All others negative except as noted per HPI.   Objective:  BP 118/78 (BP Location: Right Arm, Patient Position: Sitting, Cuff Size: Normal)   Pulse 80   Temp 98.9 F (37.2 C) (Temporal)   Resp 18   Ht 5' 7 (1.702 m)   Wt 154 lb 12.8 oz (70.2 kg)   SpO2 97%   BMI 24.25 kg/m  Body mass index is 24.25 kg/m. Physical Exam: General Appearance:  Alert, cooperative, no distress, appears stated age  Head:  Normocephalic, without obvious abnormality, atraumatic  Eyes:  Conjunctiva clear, EOM's intact  Ears EACs normal bilaterally and normal TMs bilaterally  Nose: Nares normal, normal mucosa, no visible anterior polyps, and septum midline  Throat: Lips, tongue  normal; teeth and gums normal, normal posterior oropharynx  Neck: Supple, symmetrical  Lungs:   clear to auscultation bilaterally, Respirations unlabored, intermittent dry coughing  Heart:  regular rate and rhythm and no murmur, Appears well perfused  Extremities: No  edema  Skin: Skin color, texture, turgor normal and no rashes or lesions on visualized portions of skin  Neurologic: No gross deficits   Labs:  Lab Orders  No laboratory test(s) ordered today    Spirometry:  Tracings reviewed. His effort: Good reproducible efforts. FVC: 2.78L FEV1: 2.49L, 75% predicted FEV1/FVC ratio: 90% Interpretation: Nonobstructive ratio, low FEV1, possible restriction. After 4 puffs of albuterol  there was not a significant post bronchodilator response  Please see scanned spirometry results for details.    Assessment/Plan   Patient Instructions  Cough variant asthma, mild persistent, mild flare with increased cough  Breathing test: showed inflammation in your lungs that was not reversible.   Given increase symptoms and decreased FEV1: depo medro 40mg  IM given daily   - Continue Symbicort  80mcg as needed, two puffs twice daily  - Provided spacer for use with Symbicort . - Instructed to rinse mouth after using Symbicort  to prevent yeast infection and hoarseness. - Use albuterol  90mcg 2 puffs every 4 hours as needed for cough, wheeze, and shortness of breath   Letter of support written for 2 bedroom apartment   Allergic rhinoconjunctivitis due to pollen - Continue flonase  1 spray per nostril daily for nasal congestoin  - Continue  pataday  eye drops: 1 drop per eye as needed daily for eye symptoms  Allergy  test ( 12/10/23): Negative to all environmentals and select food  Chronic hives - Recommended Zyrtec  10mg  twice daily as needed for hives until resolved.  History of Laryngeal cancer Associated with hoarseness.  Gastroesophageal reflux disease (GERD) - will continue to monitor   Follow up:  6 months   Other: reviewed spirometry technique and reviewed inhaler technique  Thank you so much for letting me partake in your care today.  Don't hesitate to reach out if you have any additional concerns!  Hargis Springer, MD  [facility] [place],  [place]

## 2024-04-22 NOTE — Patient Instructions (Addendum)
 Cough variant asthma, mild persistent, mild flare with increased cough  Breathing test: showed inflammation in your lungs that was not reversible.   Given increase symptoms and decreased FEV1: depo medro 40mg  IM given daily   - Continue Symbicort  80mcg as needed, two puffs twice daily  - Provided spacer for use with Symbicort . - Instructed to rinse mouth after using Symbicort  to prevent yeast infection and hoarseness. - Use albuterol  90mcg 2 puffs every 4 hours as needed for cough, wheeze, and shortness of breath   Letter of support written for 2 bedroom apartment   Allergic rhinoconjunctivitis due to pollen - Continue flonase  1 spray per nostril daily for nasal congestoin  - Continue  pataday  eye drops: 1 drop per eye as needed daily for eye symptoms  Allergy  test ( 12/10/23): Negative to all environmentals and select food  Chronic hives - Recommended Zyrtec  10mg  twice daily as needed for hives until resolved.  History of Laryngeal cancer Associated with hoarseness.  Gastroesophageal reflux disease (GERD) - will continue to monitor   Follow up:  6 months

## 2024-10-21 ENCOUNTER — Ambulatory Visit: Admitting: Internal Medicine
